# Patient Record
Sex: Female | Born: 2005 | Race: White | Hispanic: No | Marital: Single | Smoking: Never smoker
Health system: Southern US, Community
[De-identification: ages and names within clinical notes are randomized; demographics above are authoritative.]

## PROBLEM LIST (undated history)

## (undated) DIAGNOSIS — N809 Endometriosis, unspecified: Secondary | ICD-10-CM

## (undated) DIAGNOSIS — G90A Postural orthostatic tachycardia syndrome (POTS): Secondary | ICD-10-CM

## (undated) DIAGNOSIS — T7840XA Allergy, unspecified, initial encounter: Secondary | ICD-10-CM

## (undated) DIAGNOSIS — F419 Anxiety disorder, unspecified: Secondary | ICD-10-CM

## (undated) DIAGNOSIS — J45909 Unspecified asthma, uncomplicated: Secondary | ICD-10-CM

## (undated) DIAGNOSIS — F32A Depression, unspecified: Secondary | ICD-10-CM

## (undated) DIAGNOSIS — F329 Major depressive disorder, single episode, unspecified: Secondary | ICD-10-CM

## (undated) DIAGNOSIS — L309 Dermatitis, unspecified: Secondary | ICD-10-CM

## (undated) DIAGNOSIS — G43109 Migraine with aura, not intractable, without status migrainosus: Secondary | ICD-10-CM

## (undated) DIAGNOSIS — S022XXA Fracture of nasal bones, initial encounter for closed fracture: Secondary | ICD-10-CM

## (undated) HISTORY — DX: Dermatitis, unspecified: L30.9

## (undated) HISTORY — PX: EXCISION, ENDOMETRIOSIS, LAPAROSCOPIC: SHX7245

## (undated) HISTORY — DX: Migraine with aura, not intractable, without status migrainosus: G43.109

---
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
## 2023-02-17 ENCOUNTER — Encounter: Payer: Self-pay | Admitting: Adult Health

## 2023-02-17 ENCOUNTER — Ambulatory Visit (INDEPENDENT_AMBULATORY_CARE_PROVIDER_SITE_OTHER): Payer: 59 | Admitting: Adult Health

## 2023-02-17 DIAGNOSIS — F819 Developmental disorder of scholastic skills, unspecified: Secondary | ICD-10-CM

## 2023-02-17 DIAGNOSIS — F411 Generalized anxiety disorder: Secondary | ICD-10-CM | POA: Diagnosis not present

## 2023-02-17 DIAGNOSIS — F909 Attention-deficit hyperactivity disorder, unspecified type: Secondary | ICD-10-CM

## 2023-02-17 DIAGNOSIS — F319 Bipolar disorder, unspecified: Secondary | ICD-10-CM

## 2023-02-17 DIAGNOSIS — G4700 Insomnia, unspecified: Secondary | ICD-10-CM | POA: Diagnosis not present

## 2023-02-17 NOTE — Progress Notes (Signed)
Ashlee Perez Age [id number] [birth year] 17 y.o.  Subjective:   Patient ID:  Ashlee Perez is a 17 y.o. (DOB 01/13/2006) female.  Chief Complaint: No chief complaint on file.   HPI [name] presents to the office today for follow-up of BPD, anxiety, learning disorder, and insomnia.  Describes mood today as "not". Pleasant. Denies tearfulness. Mood symptoms - reports depression, anxiety, and irritability. Denies recent anxiety attacks. Reports worry, rumination, and over thinking. Mood has been more "consistent".Stating "I haven't been doing good". Has been trying to "fix" things on her own. Willing to restart medication. Starting therapy today at [facility] counseling. Decreased interest and motivation. Taking medications as prescribed.  Energy levels depends on sleep. Active, does not have a regular exercise routine.  Enjoys some usual interests and activities. Lives with boyfriend. Parents and sister are in [place]. Spending time with family.  Appetite adequate. Weight stable - 125 pounds - 65".  Sleeps well most nights. Averages 3 to 8 hours. Denies daytime napping. Focus and concentration "not good". Completing tasks. Managing aspects of household. Received high school diploma. 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 Denies SI or HI.  Denies AH or VH. Denies self harm.  Denies substance use.  Previous medications: Risperdal, Alvis Lemmings   Flowsheet Row ED from 02/12/2022 in [facility] Emergency Department at [facility] ED to Hosp-Admission (Discharged) from 04/24/2020 in [facility] PEDIATRICS  C-SSRS RISK CATEGORY High Risk High Risk       Review of Systems:  Review of Systems  Musculoskeletal:  Negative for gait problem.  Neurological:  Negative for tremors.  Psychiatric/Behavioral:         Please refer to HPI    Medications: I have reviewed the patient's current medications.  Current Outpatient Medications  Medication Sig Dispense  Refill   hydrOXYzine (ATARAX) 10 MG tablet TAKE 1 TABLET BY MOUTH THREE TIMES A DAY AS NEEDED 270 tablet 0   albuterol (VENTOLIN HFA) 108 (90 Base) MCG/ACT inhaler Inhale 2 puffs into the lungs every 6 (six) hours as needed. (Patient taking differently: Inhale 2 puffs into the lungs every 6 (six) hours as needed for wheezing or shortness of breath.) 1 each 1   EPINEPHrine (EPIPEN 2-PAK) 0.3 mg/0.3 mL IJ SOAJ injection Inject 0.3 mLs (0.3 mg total) into the muscle once. As needed for severe life-threatening allergic reaction (Patient taking differently: Inject 0.3 mg into the muscle once as needed for anaphylaxis (severe life-threatening allergic reaction).) 2 Device 2   norethindrone (MICRONOR) 0.35 MG tablet Take 1 tablet by mouth daily.     No current facility-administered medications for this visit.    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 nuts   Peanut-Containing Drug Products Anaphylaxis   Penicillins Hives   Serotonin Reuptake Inhibitors (Ssris) Other (See Comments)    Pt says that no one in her family can take them, they mess with "mentally"    Past Medical History:  Diagnosis Date   Allergy    Anxiety    Asthma    prn inhaler   Depression    Eczema    Migraine with aura    Nasal bone fx-closed 06/23/2012   has a cut on nose from the injury    Past Medical History, Surgical history, Social history, and Family history were reviewed and updated as appropriate.   Please see review of systems for further details on the patient's  review from today.   Objective:   Physical Exam:  There were no vitals taken for this visit.  Physical Exam Constitutional:      General: She is not in acute distress. Musculoskeletal:        General: No deformity.  Neurological:     Mental Status: She is alert and oriented to person, place, and time.     Coordination: Coordination normal.  Psychiatric:         Attention and Perception: Attention and perception normal. She does not perceive auditory or visual hallucinations.        Mood and Affect: Mood normal. Mood is not anxious or depressed. Affect is not labile, blunt, angry or inappropriate.        Speech: Speech normal.        Behavior: Behavior normal.        Thought Content: Thought content normal. Thought content is not paranoid or delusional. Thought content does not include homicidal or suicidal ideation. Thought content does not include homicidal or suicidal plan.        Cognition and Memory: Cognition and memory normal.        Judgment: Judgment normal.     Comments: Insight intact     Lab Review:     Component Value Date/Time   NA 139 02/12/2022 2348   NA 145 (H) 08/16/2015 0000   K 3.3 (L) 02/12/2022 2348   CL 107 02/12/2022 2348   CO2 23 02/12/2022 2348   GLUCOSE 123 (H) 02/12/2022 2348   BUN 12 02/12/2022 2348   BUN 9 08/16/2015 0000   CREATININE 0.72 02/12/2022 2348   CALCIUM 9.1 02/12/2022 2348   PROT 7.2 02/12/2022 2348   PROT 7.1 08/16/2015 0000   ALBUMIN 4.0 02/12/2022 2348   ALBUMIN 4.4 08/16/2015 0000   AST 21 02/12/2022 2348   ALT 12 02/12/2022 2348   ALKPHOS 67 02/12/2022 2348   BILITOT 0.9 02/12/2022 2348   BILITOT 0.3 08/16/2015 0000   GFRNONAA NOT CALCULATED 02/12/2022 2348   GFRAA NOT CALCULATED 04/24/2020 0526       Component Value Date/Time   WBC 8.7 02/12/2022 2348   RBC 4.72 02/12/2022 2348   HGB 13.1 02/12/2022 2348   HGB 13.4 08/16/2015 0000   HCT 39.6 02/12/2022 2348   HCT 38.9 08/16/2015 0000   PLT 334 02/12/2022 2348   PLT 354 08/16/2015 0000   MCV 83.9 02/12/2022 2348   MCV 77 08/16/2015 0000   MCH 27.8 02/12/2022 2348   MCHC 33.1 02/12/2022 2348   RDW 12.8 02/12/2022 2348   RDW 14.1 08/16/2015 0000   LYMPHSABS 3.6 02/12/2022 2348   LYMPHSABS 3.5 08/16/2015 0000   MONOABS 1.2 02/12/2022 2348   EOSABS 0.6 02/12/2022 2348   EOSABS 1.0 (H) 08/16/2015 0000   BASOSABS 0.0  02/12/2022 2348   BASOSABS 0.0 08/16/2015 0000    No results found for: "POCLITH", "LITHIUM"   No results found for: "PHENYTOIN", "PHENOBARB", "VALPROATE", "CBMZ"   .res Assessment: Plan:    Plan:  D/C Hydroxyzine 10mg  TID   Add Vraylar 1.5mg  - 2 weeks of samples given.   Gene sight testing given to patient for review.  Patient advised to contact office with any questions, adverse effects, or acute worsening in signs and symptoms.  RTC 2 weeks  There are no diagnoses linked to this encounter.   Please see After Visit Summary for patient specific instructions.  No future appointments.  No orders of the defined types were placed  in this encounter.   -------------------------------

## 2023-03-11 ENCOUNTER — Telehealth: Payer: Self-pay | Admitting: Adult Health

## 2023-03-11 ENCOUNTER — Ambulatory Visit: Payer: 59 | Admitting: Family Medicine

## 2023-03-11 ENCOUNTER — Other Ambulatory Visit: Payer: Self-pay

## 2023-03-11 ENCOUNTER — Encounter: Payer: Self-pay | Admitting: Family Medicine

## 2023-03-11 VITALS — BP 110/80 | HR 107 | Temp 98.5°F | Resp 16 | Wt 126.3 lb

## 2023-03-11 DIAGNOSIS — J310 Chronic rhinitis: Secondary | ICD-10-CM

## 2023-03-11 DIAGNOSIS — L2084 Intrinsic (allergic) eczema: Secondary | ICD-10-CM | POA: Diagnosis not present

## 2023-03-11 DIAGNOSIS — T7800XA Anaphylactic reaction due to unspecified food, initial encounter: Secondary | ICD-10-CM

## 2023-03-11 DIAGNOSIS — T7800XD Anaphylactic reaction due to unspecified food, subsequent encounter: Secondary | ICD-10-CM | POA: Diagnosis not present

## 2023-03-11 DIAGNOSIS — J4520 Mild intermittent asthma, uncomplicated: Secondary | ICD-10-CM | POA: Diagnosis not present

## 2023-03-11 MED ORDER — CARIPRAZINE HCL 1.5 MG PO CAPS: 1.5000 mg | ORAL_CAPSULE | Freq: Every day | ORAL | 1 refills | Status: DC

## 2023-03-11 MED ORDER — MOMETASONE FUROATE 0.1 % EX CREA: 1.0000 | TOPICAL_CREAM | Freq: Every day | CUTANEOUS | 3 refills | Status: DC

## 2023-03-11 NOTE — Progress Notes (Signed)
[address] [place] [address] Dept: [phone]  FOLLOW UP NOTE  Patient ID: Ashlee Perez, female    DOB: Jul 02, 2006  Age: 17 y.o. MRN: [id number] Date of Office Visit: 03/11/2023  Assessment  Chief Complaint: Eczema (Flaring up on hands and behind knees)  HPI Ashlee Perez is a 17 year old female who presents to the clinic for follow-up visit.  She was last seen in this clinic on 02/26/2021 as a new patient by Dr. Dellis Anes for evaluation of asthma, chronic rhinitis, atopic dermatitis, and food allergy to peanuts.    At today's visit, she reports her chronic rhinitis has been moderately well-controlled with occasional clear rhinorrhea and nasal congestion.  She is not currently taking an antihistamine or using nasal saline rinses or saline nasal spray.  Her last environmental allergy testing was on 02/26/2021 and was negative to the adult environmental percutaneous testing.  Intradermal testing was not done at that time.    Atopic dermatitis is reported as poorly controlled with red and itchy areas occurring in a flare in remission pattern.  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  She reports other areas affected include her popliteal fossa, antecubital fossa, and the extensor surfaces of bilateral elbows.  She continues a daily moisturizing routine and has previously used mometasone with relief of stubborn red itchy areas.  She occasionally uses cocoa butter for relief of symptoms.  She reports that her symptoms usually began in the winter months, however, she reports that she has been experiencing more symptoms over this springtime.    She continues to avoid peanuts and tree nuts.  Food allergy testing was positive to peanut and negative to tree nuts on 02/26/2021.  She is moderately interested in retesting food allergies and possibly moving forward with a tree nut challenge if testing looks favorable.  She  reports that her primary care provider has been updating her epinephrine autoinjector set and that her current set is within date.  Asthma is reported as well-controlled with no shortness of breath, cough, or wheeze with activity or at rest.  She reports that she has an albuterol inhaler that she uses about twice a year.  Her primary care provider has been refilling this medication for her and she reports that her albuterol inhaler is within date.  Her current medications are listed in the chart. Drug Allergies:  Allergies  Allergen Reactions   Other Anaphylaxis    Reaction to tree nuts   Peanut-Containing Drug Products Anaphylaxis   Penicillins Hives   Serotonin Reuptake Inhibitors (Ssris) Other (See Comments)    Pt says that no one in her family can take them, they mess with "mentally"    Physical Exam: BP 110/80   Pulse (!) 107   Temp 98.5 F (36.9 C) (Temporal)   Resp 16   Wt 126 lb 4.8 oz (57.3 kg)   SpO2 96%   BMI 21.02 kg/m    Physical Exam Vitals reviewed.  Constitutional:      Appearance: Normal appearance.  HENT:     Head: Normocephalic and atraumatic.     Right Ear: Tympanic membrane normal.     Left Ear: Tympanic membrane normal.     Nose:     Comments: Normal nares normal.  Pharynx normal.  Ears normal.  Eyes normal.    Mouth/Throat:     Pharynx: Oropharynx is clear.  Eyes:     Conjunctiva/sclera: Conjunctivae normal.  Cardiovascular:     Rate and Rhythm: Normal rate and regular rhythm.     Heart sounds: Normal heart sounds. No murmur heard. Pulmonary:     Effort: Pulmonary effort is normal.     Breath sounds: Normal breath sounds.     Comments: Lungs clear to auscultation Musculoskeletal:        General: Normal range of motion.     Cervical back: Normal range of motion and neck supple.  Skin:    General: Skin is warm and dry.  Neurological:     Mental Status: She is alert and oriented to person, place, and time.  Psychiatric:        Mood and  Affect: Mood normal.        Behavior: Behavior normal.        Thought Content: Thought content normal.        Judgment: Judgment normal.      Assessment and Plan: 1. Mild intermittent asthma without complication   2. Chronic rhinitis   3. Intrinsic atopic dermatitis   4. Allergy with anaphylaxis due to food     Meds ordered this encounter  Medications   mometasone (ELOCON) 0.1 % cream    Sig: Apply 1 Application topically daily.    Dispense:  45 g    Refill:  3    Patient Instructions  Asthma Continue albuterol 2 puffs once every 4 hours as needed for cough or wheeze as prescribed by your primary care provider  Chronic rhinitis Continue an antihistamine once a day as needed for runny nose or itch Consider saline nasal rinses as needed for nasal symptoms. Use this before any medicated nasal sprays for best result  Atopic dermatitis Continue a twice a day moisturizing routine Try Eucrisa to red and itchy areas twice a day as needed. This medication does not contain a steroid Restart mometasone 0.1% cream twice a day as needed to red and itchy areas under your face. Do not use longer than 2 weeks in a row.  Food allergy Continue to avoid peanuts and tree nuts. Return to the clinic to update your tree nut allergy testing.  If this is negative we can move forward with a tree nut challenge.  Call the clinic if this treatment plan is not working well for you.  Follow up in 3 months or sooner if needed.   Return in about 3 months (around 06/11/2023), or if symptoms worsen or fail to improve.    Thank you for the opportunity to care for this patient.  Please do not hesitate to contact me with questions.  Thermon Leyland, FNP [facility]

## 2023-03-11 NOTE — Patient Instructions (Addendum)
Asthma Continue albuterol 2 puffs once every 4 hours as needed for cough or wheeze as prescribed by your primary care provider  Chronic rhinitis Continue an antihistamine once a day as needed for runny nose or itch Consider saline nasal rinses as needed for nasal symptoms. Use this before any medicated nasal sprays for best result  Atopic dermatitis Continue a twice a day moisturizing routine Try Eucrisa to red and itchy areas twice a day as needed. This medication does not contain a steroid Restart mometasone 0.1% cream twice a day as needed to red and itchy areas under your face. Do not use longer than 2 weeks in a row.  Food allergy Continue to avoid peanuts and tree nuts. Return to the clinic to update your tree nut allergy testing.  If this is negative we can move forward with a tree nut challenge.  Call the clinic if this treatment plan is not working well for you.  Follow up in 3 months or sooner if needed.  Skin care recommendations   Bath time: Always use lukewarm water. AVOID very hot or cold water. Keep bathing time to 5-10 minutes. Do NOT use bubble bath. Use a mild soap and use just enough to wash the dirty areas. Do NOT scrub skin vigorously.  After bathing, pat dry your skin with a towel. Do NOT rub or scrub the skin.   Moisturizers and prescriptions:  ALWAYS apply moisturizers immediately after bathing (within 3 minutes). This helps to lock-in moisture. Use the moisturizer several times a day over the whole body. Good summer moisturizers include: Aveeno, CeraVe, Cetaphil. Good winter moisturizers include: Aquaphor, Vaseline, Cerave, Cetaphil, Eucerin, Vanicream. When using moisturizers along with medications, the moisturizer should be applied about one hour after applying the medication to prevent diluting effect of the medication or moisturize around where you applied the medications. When not using medications, the moisturizer can be continued twice daily as  maintenance.   Laundry and clothing: Avoid laundry products with added color or perfumes. Use unscented hypo-allergenic laundry products such as Tide free, Cheer free & gentle, and All free and clear.  If the skin still seems dry or sensitive, you can try double-rinsing the clothes. Avoid tight or scratchy clothing such as wool. Do not use fabric softeners or dyer sheets.

## 2023-03-11 NOTE — Telephone Encounter (Signed)
Pt called in and is requesting Vraylar 1.5mg  rx to be called in. She has had samples.  [facility]t [facility] [address] [facility] [place], [place]

## 2023-03-11 NOTE — Telephone Encounter (Signed)
Sent!

## 2023-03-17 ENCOUNTER — Encounter: Payer: Self-pay | Admitting: Adult Health

## 2023-03-17 ENCOUNTER — Ambulatory Visit (INDEPENDENT_AMBULATORY_CARE_PROVIDER_SITE_OTHER): Payer: 59 | Admitting: Adult Health

## 2023-03-17 DIAGNOSIS — F819 Developmental disorder of scholastic skills, unspecified: Secondary | ICD-10-CM | POA: Diagnosis not present

## 2023-03-17 DIAGNOSIS — F411 Generalized anxiety disorder: Secondary | ICD-10-CM | POA: Diagnosis not present

## 2023-03-17 DIAGNOSIS — G4700 Insomnia, unspecified: Secondary | ICD-10-CM | POA: Diagnosis not present

## 2023-03-17 DIAGNOSIS — F319 Bipolar disorder, unspecified: Secondary | ICD-10-CM | POA: Diagnosis not present

## 2023-03-17 NOTE — Progress Notes (Signed)
Ashlee Perez [id number] 09/26/2006 17 y.o.  Subjective:   Patient ID:  Ashlee Perez is a 17 y.o. (DOB 19-Mar-2006) female.  Chief Complaint: No chief complaint on file.   HPI Ashlee Perez presents to the office today for follow-up of BPD, anxiety, learning disorder, and insomnia.  Describes mood today as "better". Pleasant. Denies tearfulness. Mood symptoms - denies depression, anxiety, and irritability. Denies recent anxiety attacks. Reports some worry and over thinking. Mood has been "very consistent" Stating "I feel like I'm doing better". Feels like the addition of Vraylar has been helpful. Seeing a therapist at [facility] counseling. Improved interest and motivation. Taking medications as prescribed.  Energy levels "a lot better". Active, does not have a regular exercise routine.  Enjoys some usual interests and activities. Lives with boyfriend. Parents and sister are in [place]. Spending time with family.  Appetite adequate. Weight stable - 125 pounds - 65".  Sleeps well most nights. Averages 7 to 8hours. Denies daytime napping. Focus and concentration "decent". Completing tasks. Managing aspects of household. Received high school diploma. 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 May start a job at [employer]. Denies SI or HI.  Denies AH or VH. Denies self harm.  Denies substance use.  Previous medications: Risperdal, Alvis Lemmings   Flowsheet Row ED from [facility]/12/2022 in [facility] Emergency Department at [facility] ED to Hosp-Admission (Discharged) from 04/24/2020 in [facility] PEDIATRICS  C-SSRS RISK CATEGORY High Risk High Risk        Review of Systems:  Review of Systems  Musculoskeletal:  Negative for gait problem.  Neurological:  Negative for tremors.  Psychiatric/Behavioral:         Please refer to HPI    Medications: I have reviewed the patient's current medications.  Current Outpatient Medications  Medication Sig Dispense  Refill   albuterol (VENTOLIN HFA) 108 (90 Base) MCG/ACT inhaler Inhale 2 puffs into the lungs every 6 (six) hours as needed. (Patient taking differently: Inhale 2 puffs into the lungs every 6 (six) hours as needed for wheezing or shortness of breath.) 1 each 1   cariprazine (VRAYLAR) 1.5 MG capsule Take 1 capsule (1.5 mg total) by mouth daily. 30 capsule 1   EPINEPHrine (EPIPEN 2-PAK) 0.3 mg/0.3 mL IJ SOAJ injection Inject 0.3 mLs (0.3 mg total) into the muscle once. As needed for severe life-threatening allergic reaction (Patient taking differently: Inject 0.3 mg into the muscle once as needed for anaphylaxis (severe life-threatening allergic reaction).) 2 Device 2   mometasone (ELOCON) 0.1 % cream Apply 1 Application topically daily. 45 g 3   norethindrone (MICRONOR) 0.35 MG tablet Take 1 tablet by mouth daily.     No current facility-administered medications for this visit.    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 nuts   Peanut-Containing Drug Products Anaphylaxis   Penicillins Hives   Serotonin Reuptake Inhibitors (Ssris) Other (See Comments)    Pt says that no one in her family can take them, they mess with "mentally"    Past Medical History:  Diagnosis Date   Allergy    Anxiety    Asthma    prn inhaler   Depression    Eczema    Migraine with aura    Nasal bone fx-closed 06/23/2012   has a cut on nose from the injury    Past Medical History, Surgical history, Social history, and Family history were reviewed  and updated as appropriate.   Please see review of systems for further details on the patient's review from today.   Objective:   Physical Exam:  There were no vitals taken for this visit.  Physical Exam Constitutional:      General: She is not in acute distress. Musculoskeletal:        General: No deformity.  Neurological:     Mental Status: She is alert and oriented to person,  place, and time.     Coordination: Coordination normal.  Psychiatric:        Attention and Perception: Attention and perception normal. She does not perceive auditory or visual hallucinations.        Mood and Affect: Mood normal. Mood is not anxious or depressed. Affect is not labile, blunt, angry or inappropriate.        Speech: Speech normal.        Behavior: Behavior normal.        Thought Content: Thought content normal. Thought content is not paranoid or delusional. Thought content does not include homicidal or suicidal ideation. Thought content does not include homicidal or suicidal plan.        Cognition and Memory: Cognition and memory normal.        Judgment: Judgment normal.     Comments: Insight intact     Lab Review:     Component Value Date/Time   NA 139 02/12/2022 2348   NA 145 (H) 08/16/2015 0000   K 3.3 (L) 02/12/2022 2348   CL 107 02/12/2022 2348   CO2 23 02/12/2022 2348   GLUCOSE 123 (H) 02/12/2022 2348   BUN 12 02/12/2022 2348   BUN 9 08/16/2015 0000   CREATININE 0.72 02/12/2022 2348   CALCIUM 9.1 02/12/2022 2348   PROT 7.2 02/12/2022 2348   PROT 7.1 08/16/2015 0000   ALBUMIN 4.0 02/12/2022 2348   ALBUMIN 4.4 08/16/2015 0000   AST 21 02/12/2022 2348   ALT 12 02/12/2022 2348   ALKPHOS 67 02/12/2022 2348   BILITOT 0.9 02/12/2022 2348   BILITOT 0.3 08/16/2015 0000   GFRNONAA NOT CALCULATED 02/12/2022 2348   GFRAA NOT CALCULATED 04/24/2020 0526       Component Value Date/Time   WBC 8.7 02/12/2022 2348   RBC 4.72 02/12/2022 2348   HGB 13.1 02/12/2022 2348   HGB 13.4 08/16/2015 0000   HCT 39.6 02/12/2022 2348   HCT 38.9 08/16/2015 0000   PLT 334 02/12/2022 2348   PLT 354 08/16/2015 0000   MCV 83.9 02/12/2022 2348   MCV 77 08/16/2015 0000   MCH 27.8 02/12/2022 2348   MCHC 33.1 02/12/2022 2348   RDW 12.8 02/12/2022 2348   RDW 14.1 08/16/2015 0000   LYMPHSABS 3.6 02/12/2022 2348   LYMPHSABS 3.5 08/16/2015 0000   MONOABS 1.2 02/12/2022 2348   EOSABS  0.6 02/12/2022 2348   EOSABS 1.0 (H) 08/16/2015 0000   BASOSABS 0.0 02/12/2022 2348   BASOSABS 0.0 08/16/2015 0000    No results found for: "POCLITH", "LITHIUM"   No results found for: "PHENYTOIN", "PHENOBARB", "VALPROATE", "CBMZ"   .res Assessment: Plan:    Plan:  Continue Vraylar 1.5mg  daily   Patient advised to contact office with any questions, adverse effects, or acute worsening in signs and symptoms.  RTC 6 weeks  There are no diagnoses linked to this encounter.   Please see After Visit Summary for patient specific instructions.  No future appointments.  No orders of the defined types were placed in this encounter.   -------------------------------

## 2023-03-23 ENCOUNTER — Inpatient Hospital Stay (HOSPITAL_COMMUNITY)
Admission: AD | Admit: 2023-03-23 | Discharge: 2023-03-23 | Disposition: A | Discharge status: Home / Self Care | Payer: 59 | Attending: Family Medicine | Admitting: Family Medicine

## 2023-03-23 ENCOUNTER — Telehealth (HOSPITAL_BASED_OUTPATIENT_CLINIC_OR_DEPARTMENT_OTHER): Payer: Self-pay | Admitting: Obstetrics & Gynecology

## 2023-03-23 ENCOUNTER — Other Ambulatory Visit: Payer: Self-pay

## 2023-03-23 DIAGNOSIS — Z3202 Encounter for pregnancy test, result negative: Secondary | ICD-10-CM | POA: Diagnosis present

## 2023-03-23 DIAGNOSIS — N939 Abnormal uterine and vaginal bleeding, unspecified: Secondary | ICD-10-CM | POA: Insufficient documentation

## 2023-03-23 LAB — POCT PREGNANCY, URINE: Preg Test, Ur: NEGATIVE

## 2023-03-23 NOTE — MAU Provider Note (Signed)
None    S Ms. Ashlee Perez is a 17 y.o. G0P0000 patient who presents to MAU today with complaint of bleeding and cramping. She reports vaginal bleeding and cramping started 5 days ago. It initially started as spotting however yesterday became heavier. She reports she had to change a regular sized tampon every 2 hours however if she wore a pad instead of a tampon there would not be much blood on it. She reports she passed several quarter-sized clots. She is concerned about having a miscarriage despite having negative pregnancy tests at home. Prior to this current episode of bleeding, her last menstrual period was on 02/26/2023. She is not on contraception. She does not desire to be pregnant. She has an appointment with an OBGYN next Monday at [facility] in [place] to see if she "has any reproductive problems".   O BP 133/72 (BP Location: Right Arm)   Pulse 72   Temp 97.9 F (36.6 C) (Oral)   Resp 18   Ht 5\' 5"  (1.651 m)   Wt 56.6 kg   LMP 02/26/2023   SpO2 99%   BMI 20.77 kg/m   Physical Exam Vitals and nursing note reviewed.  Constitutional:      General: She is not in acute distress. Eyes:     Extraocular Movements: Extraocular movements intact.     Pupils: Pupils are equal, round, and reactive to light.  Cardiovascular:     Rate and Rhythm: Normal rate.  Pulmonary:     Effort: Pulmonary effort is normal.  Abdominal:     Palpations: Abdomen is soft.     Tenderness: There is no abdominal tenderness.  Musculoskeletal:        General: Normal range of motion.     Cervical back: Normal range of motion.  Skin:    General: Skin is warm and dry.  Neurological:     General: No focal deficit present.     Mental Status: She is alert and oriented to person, place, and time.  Psychiatric:        Mood and Affect: Mood normal.        Behavior: Behavior normal.    Results for orders placed or performed during the hospital encounter of 03/23/23 (from the past 24 hour(s))  Pregnancy,  urine POC     Status: None   Collection Time: 03/23/23  3:38 PM  Result Value Ref Range   Preg Test, Ur NEGATIVE NEGATIVE   A Medical screening exam complete 1. Pregnancy test negative   2. Vaginal bleeding    P Discharge from MAU in stable condition Recommend scheduled Ibuprofen Q6H for 2-3 days Keep appointment with OBGYN as scheduled Bleeding precautions given  Brand Males, CNM 03/23/2023 6:45 PM

## 2023-03-23 NOTE — Telephone Encounter (Signed)
17 yo G0 SWF calling to say she thinks she is having a miscarriage and wants to know if she should go to the emergency room.  Records reviewed prior to calling pt.  She was seen in the MAU this afternoon.  MAU note reviewed.  Pulse 72.  BP 133/72.  Resp 18.    Discussed with pt UPT was negative (and she has not taken any home UPTs) so likely this is an abnormal menstrual cycle.  To her, biggest concern is abnormal bleeding.  Today, if has a pad on, there is just some spotting.  But if wears a tampon, this will need to be changed in an hour or so.  Is having some cramping.  H/o micronor use but stopped this due to side effects.    Bleeding precautions given.  Advised pt if she felt she needed to go to the ER, then she should go.  Oral progesterone offered.  She declined but felt she would prefer to not to to ER but be seen in the office if possible.  Advised I would send communication to [facility] office for possible work in appt.

## 2023-03-23 NOTE — MAU Note (Signed)
Ashlee Perez is a 17 y.o. at Unknown here in MAU reporting: she's having VB and abdominal cramping.  States VB was initially spotting but now bleeding heavier and passing clots. Reports when wearing a tampon, changing every 2 hours. Hasn't taken a HPT, assuming she's pregnant because she doesn't usually have cramping or pass clots during her cycle. LMP: 02/26/2023 Onset of complaint: 5 days ago Pain score: 3 Vitals:   03/23/23 1527  BP: 133/72  Pulse: 72  Resp: 18  Temp: 97.9 F (36.6 C)  SpO2: 99%     FHT:NA Lab orders placed from triage:   UPT

## 2023-03-24 NOTE — Telephone Encounter (Signed)
Spoke with patient, advised per Dr. Jertson. Patient verbalizes understanding and is agreeable.  

## 2023-03-24 NOTE — Telephone Encounter (Signed)
Spoke with patient. Patient reports continued lower abdominal cramping, currently 2 out of 10 on pain scale. Took OTC ibuprofen 30 min ago, pain started at 7 out of 10. Describes cramping as "more extreme than period", comes and goes. Bleeding is spotting with several quarter to half dollar size clots. UPT negative during ER visit 02/20/23. Reports nausea, denies vomiting, fever/chills. No longer taking POP. Denies s/s of anemia. Advised I will review with covering provider and return call, patient agreeable. Patient is available for OV today if recommended.   Dr. Oscar La  -please review and advise.

## 2023-03-24 NOTE — Telephone Encounter (Signed)
If her pain is managed with the ibuprofen she can take it for the next few days. She has an appointment with new GYN on Monday. If her pain is severe prior to then or if she is saturating more than a pad an hour, she needs to go to the ER.

## 2023-04-28 ENCOUNTER — Ambulatory Visit (INDEPENDENT_AMBULATORY_CARE_PROVIDER_SITE_OTHER): Payer: Self-pay | Admitting: Adult Health

## 2023-04-28 DIAGNOSIS — Z0389 Encounter for observation for other suspected diseases and conditions ruled out: Secondary | ICD-10-CM

## 2023-04-28 NOTE — Progress Notes (Signed)
Patient no show appointment. ? ?

## 2023-05-24 ENCOUNTER — Other Ambulatory Visit: Payer: Self-pay | Admitting: Adult Health

## 2023-07-11 ENCOUNTER — Other Ambulatory Visit: Payer: Self-pay | Admitting: Adult Health

## 2023-07-15 ENCOUNTER — Other Ambulatory Visit: Payer: Self-pay

## 2023-07-15 ENCOUNTER — Telehealth: Payer: Self-pay | Admitting: Adult Health

## 2023-07-15 MED ORDER — CARIPRAZINE HCL 1.5 MG PO CAPS: 1.5000 mg | ORAL_CAPSULE | Freq: Every day | ORAL | 0 refills | Status: DC

## 2023-07-15 NOTE — Telephone Encounter (Signed)
PENDED

## 2023-07-15 NOTE — Telephone Encounter (Signed)
Ashlee Perez called at 9:45 to request refill of her Vraylar.  Made appt for 11/6.  [id number] [place], [place] - [address]

## 2023-08-05 ENCOUNTER — Ambulatory Visit: Payer: 59 | Admitting: Adult Health

## 2023-08-11 ENCOUNTER — Ambulatory Visit: Payer: 59 | Admitting: Adult Health

## 2023-08-18 ENCOUNTER — Encounter: Payer: Self-pay | Admitting: Adult Health

## 2023-08-18 ENCOUNTER — Ambulatory Visit: Payer: 59 | Admitting: Adult Health

## 2023-08-18 DIAGNOSIS — F819 Developmental disorder of scholastic skills, unspecified: Secondary | ICD-10-CM | POA: Diagnosis not present

## 2023-08-18 DIAGNOSIS — F411 Generalized anxiety disorder: Secondary | ICD-10-CM

## 2023-08-18 DIAGNOSIS — G4700 Insomnia, unspecified: Secondary | ICD-10-CM | POA: Diagnosis not present

## 2023-08-18 DIAGNOSIS — F319 Bipolar disorder, unspecified: Secondary | ICD-10-CM | POA: Diagnosis not present

## 2023-08-18 MED ORDER — CARIPRAZINE HCL 1.5 MG PO CAPS
1.5000 mg | ORAL_CAPSULE | Freq: Every day | ORAL | 2 refills | Status: DC
Start: 2023-08-18 — End: 2023-10-13

## 2023-08-18 NOTE — Progress Notes (Signed)
Ashlee Perez [id number] 2005-10-30 17 y.o.  Subjective:   Patient ID:  Ashlee Perez is a 17 y.o. (DOB [birth year]) female.  Chief Complaint: No chief complaint on file.   HPI Ashlee Perez presents to the office today for follow-up of BPD, anxiety, learning disorder, and insomnia.  Describes mood today as "ok". Pleasant. Denies tearfulness. Mood symptoms - reports some depression, anxiety, and irritability. Denies recent anxiety attacks. Reports some worry and over thinking. Denies rumination. Mood is stable. Stating "I am able to manage and regulate". Feels like the  Vraylar works well. Improved interest and motivation. Taking medications as prescribed.  Energy levels "a lot better". Active, does not have a regular exercise routine.  Enjoys some usual interests and activities. Lives with boyfriend. Parents and sister are in [place]. Spending time with family.  Appetite adequate. Weight stable - 125 pounds - 65".  Sleeps well most nights. Averages 7 to 8 hours. Reports some daytime napping. Focus and concentration "ok". Completing tasks. Managing aspects of household. Received high school diploma. Working at [employer].  Denies SI or HI.  Denies AH or VH. Denies self harm.  Denies substance use.  Previous medications: Risperdal, Ashlee Perez   Flowsheet Row ED from 02/12/2022 in [facility] Emergency Department at [facility] ED to Hosp-Admission (Discharged) from 04/24/2020 in [facility] PEDIATRICS  C-SSRS RISK CATEGORY High Risk High Risk        Review of Systems:  Review of Systems  Musculoskeletal:  Negative for gait problem.  Neurological:  Negative for tremors.  Psychiatric/Behavioral:         Please refer to HPI    Medications: I have reviewed the patient's current medications.  Current Outpatient Medications  Medication Sig Dispense Refill   albuterol (VENTOLIN HFA) 108 (90 Base) MCG/ACT inhaler Inhale 2 puffs into the  lungs every 6 (six) hours as needed. (Patient taking differently: Inhale 2 puffs into the lungs every 6 (six) hours as needed for wheezing or shortness of breath.) 1 each 1   cariprazine (VRAYLAR) 1.5 MG capsule Take 1 capsule (1.5 mg total) by mouth daily. 30 capsule 0   EPINEPHrine (EPIPEN 2-PAK) 0.3 mg/0.3 mL IJ SOAJ injection Inject 0.3 mLs (0.3 mg total) into the muscle once. As needed for severe life-threatening allergic reaction (Patient taking differently: Inject 0.3 mg into the muscle once as needed for anaphylaxis (severe life-threatening allergic reaction).) 2 Device 2   mometasone (ELOCON) 0.1 % cream Apply 1 Application topically daily. 45 g 3   norethindrone (MICRONOR) 0.35 MG tablet Take 1 tablet by mouth daily.     No current facility-administered medications for this visit.    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 nuts   Peanut-Containing Drug Products Anaphylaxis   Penicillins Hives   Serotonin Reuptake Inhibitors (Ssris) Other (See Comments)    Pt says that no one in her family can take them, they mess with "mentally"    Past Medical History:  Diagnosis Date   Allergy    Anxiety    Asthma    prn inhaler   Depression    Eczema    Migraine with aura    Nasal bone fx-closed 06/23/2012   has a cut on nose from the injury    Past Medical History, Surgical history, Social history, and Family history were reviewed and updated as appropriate.   Please see review of systems for  further details on the patient's review from today.   Objective:   Physical Exam:  There were no vitals taken for this visit.  Physical Exam Constitutional:      General: She is not in acute distress. Musculoskeletal:        General: No deformity.  Neurological:     Mental Status: She is alert and oriented to person, place, and time.     Coordination: Coordination normal.  Psychiatric:        Attention and  Perception: Attention and perception normal. She does not perceive auditory or visual hallucinations.        Mood and Affect: Mood normal. Mood is not anxious or depressed. Affect is not labile, blunt, angry or inappropriate.        Speech: Speech normal.        Behavior: Behavior normal.        Thought Content: Thought content normal. Thought content is not paranoid or delusional. Thought content does not include homicidal or suicidal ideation. Thought content does not include homicidal or suicidal plan.        Cognition and Memory: Cognition and memory normal.        Judgment: Judgment normal.     Comments: Insight intact     Lab Review:     Component Value Date/Time   NA 139 02/12/2022 2348   NA 145 (H) 08/16/2015 0000   K 3.3 (L) 02/12/2022 2348   CL 107 02/12/2022 2348   CO2 23 02/12/2022 2348   GLUCOSE 123 (H) 02/12/2022 2348   BUN 12 02/12/2022 2348   BUN 9 08/16/2015 0000   CREATININE 0.72 02/12/2022 2348   CALCIUM 9.1 02/12/2022 2348   PROT 7.2 02/12/2022 2348   PROT 7.1 08/16/2015 0000   ALBUMIN 4.0 02/12/2022 2348   ALBUMIN 4.4 08/16/2015 0000   AST 21 02/12/2022 2348   ALT 12 02/12/2022 2348   ALKPHOS 67 02/12/2022 2348   BILITOT 0.9 02/12/2022 2348   BILITOT 0.3 08/16/2015 0000   GFRNONAA NOT CALCULATED 02/12/2022 2348   GFRAA NOT CALCULATED 04/24/2020 0526       Component Value Date/Time   WBC 8.7 02/12/2022 2348   RBC 4.72 02/12/2022 2348   HGB 13.1 02/12/2022 2348   HGB 13.4 08/16/2015 0000   HCT 39.6 02/12/2022 2348   HCT 38.9 08/16/2015 0000   PLT 334 02/12/2022 2348   PLT 354 08/16/2015 0000   MCV 83.9 02/12/2022 2348   MCV 77 08/16/2015 0000   MCH 27.8 02/12/2022 2348   MCHC 33.1 02/12/2022 2348   RDW 12.8 02/12/2022 2348   RDW 14.1 08/16/2015 0000   LYMPHSABS 3.6 02/12/2022 2348   LYMPHSABS 3.5 08/16/2015 0000   MONOABS 1.2 02/12/2022 2348   EOSABS 0.6 02/12/2022 2348   EOSABS 1.0 (H) 08/16/2015 0000   BASOSABS 0.0 02/12/2022 2348    BASOSABS 0.0 08/16/2015 0000    No results found for: "POCLITH", "LITHIUM"   No results found for: "PHENYTOIN", "PHENOBARB", "VALPROATE", "CBMZ"   .res Assessment: Plan:    Plan:  Continue Vraylar 1.5mg  daily   Patient advised to contact office with any questions, adverse effects, or acute worsening in signs and symptoms.  RTC 3 months  There are no diagnoses linked to this encounter.   Please see After Visit Summary for patient specific instructions.  Future Appointments  Date Time Provider Department Center  08/18/2023  1:40 PM Braileigh Landenberger, Thereasa Solo, NP [facility] None    No orders of the defined  types were placed in this encounter.   -------------------------------

## 2023-09-08 ENCOUNTER — Emergency Department (HOSPITAL_BASED_OUTPATIENT_CLINIC_OR_DEPARTMENT_OTHER): Payer: 59

## 2023-09-08 ENCOUNTER — Encounter (HOSPITAL_BASED_OUTPATIENT_CLINIC_OR_DEPARTMENT_OTHER): Payer: Self-pay | Admitting: Emergency Medicine

## 2023-09-08 ENCOUNTER — Emergency Department (HOSPITAL_BASED_OUTPATIENT_CLINIC_OR_DEPARTMENT_OTHER)
Admission: EM | Admit: 2023-09-08 | Discharge: 2023-09-09 | Disposition: A | Discharge status: Home / Self Care | Payer: 59 | Attending: Emergency Medicine | Admitting: Emergency Medicine

## 2023-09-08 ENCOUNTER — Other Ambulatory Visit: Payer: Self-pay

## 2023-09-08 DIAGNOSIS — R072 Precordial pain: Secondary | ICD-10-CM | POA: Diagnosis present

## 2023-09-08 DIAGNOSIS — E876 Hypokalemia: Secondary | ICD-10-CM | POA: Diagnosis not present

## 2023-09-08 DIAGNOSIS — Z91010 Allergy to peanuts: Secondary | ICD-10-CM | POA: Diagnosis not present

## 2023-09-08 DIAGNOSIS — R0789 Other chest pain: Secondary | ICD-10-CM | POA: Insufficient documentation

## 2023-09-08 DIAGNOSIS — R0602 Shortness of breath: Secondary | ICD-10-CM | POA: Diagnosis not present

## 2023-09-08 DIAGNOSIS — J45909 Unspecified asthma, uncomplicated: Secondary | ICD-10-CM | POA: Diagnosis not present

## 2023-09-08 LAB — BASIC METABOLIC PANEL
Anion gap: 7 (ref 5–15)
BUN: 17 mg/dL (ref 6–20)
CO2: 25 mmol/L (ref 22–32)
Calcium: 8.9 mg/dL (ref 8.9–10.3)
Chloride: 102 mmol/L (ref 98–111)
Creatinine, Ser: 0.79 mg/dL (ref 0.44–1.00)
GFR, Estimated: >60 mL/min (ref >60)
Glucose, Bld: 95 mg/dL (ref 70–99)
Potassium: 3.4 mmol/L — ABNORMAL LOW (ref 3.5–5.1)
Sodium: 134 mmol/L — ABNORMAL LOW (ref 135–145)

## 2023-09-08 LAB — CBC
HCT: 37.6 % (ref 36.0–46.0)
Hemoglobin: 12.9 g/dL (ref 12.0–15.0)
MCH: 28.2 pg (ref 26.0–34.0)
MCHC: 34.3 g/dL (ref 30.0–36.0)
MCV: 82.1 fL (ref 80.0–100.0)
Platelets: 291 10*3/uL (ref 150–400)
RBC: 4.58 MIL/uL (ref 3.87–5.11)
RDW: 12.6 % (ref 11.5–15.5)
WBC: 8.2 10*3/uL (ref 4.0–10.5)
nRBC: 0 % (ref 0.0–0.2)

## 2023-09-08 LAB — PREGNANCY, URINE: Preg Test, Ur: NEGATIVE

## 2023-09-08 LAB — TROPONIN I (HIGH SENSITIVITY)
Troponin I (High Sensitivity): <2 ng/L (ref <18)
Troponin I (High Sensitivity): <2 ng/L (ref <18)

## 2023-09-08 LAB — D-DIMER, QUANTITATIVE: D-Dimer, Quant: 1.48 ug{FEU}/mL — ABNORMAL HIGH (ref 0.00–0.50)

## 2023-09-08 MED ORDER — IOHEXOL 350 MG/ML SOLN
75.0000 mL | Freq: Once | INTRAVENOUS | Status: AC | PRN
  Administered 2023-09-08: 75 mL via INTRAVENOUS

## 2023-09-08 NOTE — ED Provider Notes (Signed)
[facility] Provider Note   CSN: [id number] Arrival date & time: 09/08/23  2054     History  Chief Complaint  Patient presents with   Chest Pain    Ashlee Perez is a 17 y.o. female.  The history is provided by the patient and medical records. No language interpreter was used.  Chest Pain Pain location:  L chest and substernal area Pain quality: sharp, stabbing and tightness   Pain radiates to:  Does not radiate Pain severity:  Moderate Onset quality:  Gradual Duration:  1 day Timing:  Intermittent Progression:  Waxing and waning Chronicity:  New Relieved by:  Nothing Worsened by:  Deep breathing Ineffective treatments:  None tried Associated symptoms: palpitations and shortness of breath   Associated symptoms: no abdominal pain, no altered mental status, no back pain, no claudication, no cough, no diaphoresis, no fatigue, no fever, no headache, no nausea, no near-syncope, no syncope (2 weeks ago had an episode) and no vomiting   Risk factors: not pregnant and no prior DVT/PE        Home Medications Prior to Admission medications   Medication Sig Start Date End Date Taking? Authorizing Provider  albuterol (VENTOLIN HFA) 108 (90 Base) MCG/ACT inhaler Inhale 2 puffs into the lungs every 6 (six) hours as needed. Patient taking differently: Inhale 2 puffs into the lungs every 6 (six) hours as needed for wheezing or shortness of breath. 02/26/21   Alfonse Spruce, MD  cariprazine (VRAYLAR) 1.5 MG capsule Take 1 capsule (1.5 mg total) by mouth daily. 08/18/23   Mozingo, Thereasa Solo, NP  EPINEPHrine (EPIPEN 2-PAK) 0.3 mg/0.3 mL IJ SOAJ injection Inject 0.3 mLs (0.3 mg total) into the muscle once. As needed for severe life-threatening allergic reaction Patient taking differently: Inject 0.3 mg into the muscle once as needed for anaphylaxis (severe life-threatening allergic reaction). 08/15/15   Kozlow, Alvira Philips, MD   mometasone (ELOCON) 0.1 % cream Apply 1 Application topically daily. 03/11/23   Hetty Blend, FNP  norethindrone (MICRONOR) 0.35 MG tablet Take 1 tablet by mouth daily.    [provider]      Allergies    Other, Peanut-containing drug products, Penicillins, and Serotonin reuptake inhibitors (ssris)    Review of Systems   Review of Systems  Constitutional:  Negative for chills, diaphoresis, fatigue and fever.  HENT:  Negative for congestion.   Eyes:  Negative for visual disturbance.  Respiratory:  Positive for chest tightness and shortness of breath. Negative for cough and wheezing.   Cardiovascular:  Positive for chest pain and palpitations. Negative for claudication, leg swelling, syncope (2 weeks ago had an episode) and near-syncope.  Gastrointestinal:  Negative for abdominal pain, constipation, diarrhea, nausea and vomiting.  Genitourinary:  Negative for dysuria and flank pain.  Musculoskeletal:  Negative for back pain and neck pain.  Skin:  Negative for wound.  Neurological:  Negative for light-headedness and headaches.  Psychiatric/Behavioral:  Negative for agitation.   All other systems reviewed and are negative.   Physical Exam Updated Vital Signs BP 118/87 (BP Location: Left Arm)   Pulse 88   Temp 97.8 F (36.6 C)   Resp 20   Ht 5\' 5"  (1.651 m)   Wt 59 kg   LMP 08/06/2023 (Exact Date)   SpO2 98%   BMI 21.63 kg/m  Physical Exam Vitals and nursing note reviewed.  Constitutional:      General: She is not in acute distress.  Appearance: She is well-developed. She is not ill-appearing, toxic-appearing or diaphoretic.  HENT:     Head: Normocephalic and atraumatic.  Eyes:     Conjunctiva/sclera: Conjunctivae normal.     Pupils: Pupils are equal, round, and reactive to light.  Cardiovascular:     Rate and Rhythm: Normal rate and regular rhythm.     Heart sounds: Normal heart sounds. No murmur heard. Pulmonary:     Effort: Pulmonary effort is normal. No  tachypnea or respiratory distress.     Breath sounds: Normal breath sounds. No decreased breath sounds, wheezing, rhonchi or rales.  Chest:     Chest wall: No tenderness.  Abdominal:     Palpations: Abdomen is soft.     Tenderness: There is no abdominal tenderness.  Musculoskeletal:        General: No swelling.     Cervical back: Neck supple.     Right lower leg: No tenderness. No edema.     Left lower leg: No tenderness. No edema.  Skin:    General: Skin is warm and dry.     Capillary Refill: Capillary refill takes less than 2 seconds.     Findings: No erythema.  Neurological:     General: No focal deficit present.     Mental Status: She is alert.  Psychiatric:        Mood and Affect: Mood normal.     ED Results / Procedures / Treatments   Labs (all labs ordered are listed, but only abnormal results are displayed) Labs Reviewed  BASIC METABOLIC PANEL - Abnormal; Notable for the following components:      Result Value   Sodium 134 (*)    Potassium 3.4 (*)    All other components within normal limits  CBC  PREGNANCY, URINE  D-DIMER, QUANTITATIVE  TROPONIN I (HIGH SENSITIVITY)  TROPONIN I (HIGH SENSITIVITY)    EKG EKG Interpretation Date/Time:  Tuesday September 08 2023 21:01:18 EST Ventricular Rate:  87 PR Interval:  120 QRS Duration:  84 QT Interval:  368 QTC Calculation: 442 R Axis:   95  Text Interpretation: Normal sinus rhythm Rightward axis T wave abnormality, consider inferior ischemia Abnormal ECG When compared with ECG of 13-Feb-2022 09:44, PREVIOUS ECG IS PRESENT when compared to prior, overall similar appearance. No STEMI Confirmed by Theda Belfast ([id number]) on 09/08/2023 9:12:16 PM  Radiology DG Chest 2 View  Result Date: 09/08/2023 CLINICAL DATA:  Left-sided chest pain and shortness of breath since this morning EXAM: CHEST - 2 VIEW COMPARISON:  08/28/2015 FINDINGS: Frontal and lateral views of the chest demonstrate an unremarkable cardiac silhouette.  No acute airspace disease, effusion, or pneumothorax. No acute bony abnormalities. IMPRESSION: 1. No acute intrathoracic process. Electronically Signed   By: Sharlet Salina M.D.   On: 09/08/2023 21:28    Procedures Procedures    Medications Ordered in ED Medications - No data to display  ED Course/ Medical Decision Making/ A&P                                 Medical Decision Making Amount and/or Complexity of Data Reviewed Labs: ordered. Radiology: ordered.    Ashlee Perez is a 17 y.o. female with a past medical history significant for asthma, bipolar disorder, migraines, depression, eczema, and migraines who presents with chest pain, shortness of breath, and recent syncope.  According to patient, about 2 weeks ago she had a syncopal episode where  she had palpitations, chest pain, and had to lay down.  She reports she stood up and then passed out.  She is scheduled to follow-up with a cardiologist and continue a cardiac workup but she suspects she may have POTS.  She does have a PFO she reports but otherwise does not have any other formal cardiac diagnoses.  She says that since this morning, she woke and was having a squeezing pain gripping her heart and then stabbing pain coming and going.  She reports a stabbing lasts for several minutes at a time and then goes away.  She has had on and off today.  She reports associated shortness of breath with it but denies any syncope today.  She reports no nausea, vomiting, or diaphoresis.  Denies any other constipation, diarrhea, or urinary changes.  No leg pain or leg swelling and denies any personal or family history of blood clots.  Denies any trauma.  Denies any family history of early heart disease.  Reports the pain is moderate to severe at times.  On exam, lungs clear.  Chest nontender.  No rash.  Abdomen nontender.  Legs nontender nonedematous.  Patient resting comfortably and well-appearing.  No focal neurologic deficits.  Vital signs  reassuring on arrival without tachycardia, tachypnea, or hypoxia.  EKG does not show STEMI then show some T wave inversion similar to prior.  Clinically I am suspicious for musculoskeletal type pain however with her report of recent syncope with chest pain and shortness of breath that she does report is pleuritic at times today, I do feel she needs workup to rule out a PE.  Will get a D-dimer and a delta troponin.  She will get other labs and chest x-ray as well.  Patient's initial troponin is negative, will trend.  Metabolic panel showed mild hypokalemia of 3.4 but otherwise did not show evidence of AKI.  CBC reassuring.  She is not pregnant.  Chest x-ray did not show acute abnormality.  If workup is reassuring, anticipate discharge home and have her follow-up with her PCP and continue outpatient workup for these episodes of lightheadedness and chest discomfort.  11:11 PM Patient's workup continues to return.  Her troponin is less than 2.  Will trend.  Her D-dimer is elevated at 1.48, will get CT PE study.  Care transferred to oncoming team to wait for delta troponin and CT PE study results.  If workup reassuring, anticipate discharge home to continue outpatient workup.         Final Clinical Impression(s) / ED Diagnoses Final diagnoses:  Atypical chest pain  Shortness of breath    Clinical Impression: 1. Atypical chest pain   2. Shortness of breath     Disposition: Care transferred to oncoming team to wait for results of repeat troponin and CT PE study.  Anticipate discharge if workup reassuring.  This note was prepared with assistance of Conservation officer, historic buildings. Occasional wrong-word or sound-a-like substitutions may have occurred due to the inherent limitations of voice recognition software.     Kashawn Manzano, Canary Brim, MD 09/08/23 [id number]

## 2023-09-08 NOTE — ED Provider Notes (Signed)
11:29 PM Assumed care from Dr. Rush Landmark, please see their note for full history, physical and decision making until this point. In brief this is a 17 y.o. year old female who presented to the ED tonight with Chest Pain     Pleurisy, syncope, dyspnea. D dimer up, needs cta. Needs second troponin.   Second troponin ok. CTA unremarkable. VSS. Asymptomatic at this time. Will continue outpatient follow up for PFO/POTS, will return for new/worsening symptoms.   Discharge instructions, including strict return precautions for new or worsening symptoms, given. Patient and/or family verbalized understanding and agreement with the plan as described.   Labs, studies and imaging reviewed by myself and considered in medical decision making if ordered. Imaging interpreted by radiology.  Labs Reviewed  BASIC METABOLIC PANEL - Abnormal; Notable for the following components:      Result Value   Sodium 134 (*)    Potassium 3.4 (*)    All other components within normal limits  D-DIMER, QUANTITATIVE - Abnormal; Notable for the following components:   D-Dimer, Quant 1.48 (*)    All other components within normal limits  CBC  PREGNANCY, URINE  TROPONIN I (HIGH SENSITIVITY)  TROPONIN I (HIGH SENSITIVITY)    DG Chest 2 View  Final Result    CT Angio Chest PE W and/or Wo Contrast    (Results Pending)    No follow-ups on file.    Marthella Osorno, Barbara Cower, MD 09/09/23 [phone]

## 2023-09-08 NOTE — ED Triage Notes (Signed)
Pt presents to the ED via POV with complaints of L sided CP and SOB that started this AM. Pt states that the pain is localized around the L side of her chest and it feels like someone is "stabbing me in the chest", the pain is intermittent in nature. She notes taking two motrin around 1800 tonight with no improvement in her sx. A&Ox4 at this time. Denies fevers, chills, abdominal pain, N/V/D.

## 2023-09-11 ENCOUNTER — Other Ambulatory Visit: Payer: Self-pay

## 2023-09-11 ENCOUNTER — Emergency Department (HOSPITAL_BASED_OUTPATIENT_CLINIC_OR_DEPARTMENT_OTHER): Payer: 59

## 2023-09-11 ENCOUNTER — Encounter (HOSPITAL_BASED_OUTPATIENT_CLINIC_OR_DEPARTMENT_OTHER): Payer: Self-pay

## 2023-09-11 DIAGNOSIS — J45909 Unspecified asthma, uncomplicated: Secondary | ICD-10-CM | POA: Insufficient documentation

## 2023-09-11 DIAGNOSIS — F32A Depression, unspecified: Secondary | ICD-10-CM | POA: Diagnosis not present

## 2023-09-11 DIAGNOSIS — F419 Anxiety disorder, unspecified: Secondary | ICD-10-CM | POA: Diagnosis not present

## 2023-09-11 DIAGNOSIS — R072 Precordial pain: Secondary | ICD-10-CM | POA: Diagnosis present

## 2023-09-11 DIAGNOSIS — Z91010 Allergy to peanuts: Secondary | ICD-10-CM | POA: Diagnosis not present

## 2023-09-11 LAB — BASIC METABOLIC PANEL
Anion gap: 7 (ref 5–15)
BUN: 14 mg/dL (ref 6–20)
CO2: 23 mmol/L (ref 22–32)
Calcium: 9.1 mg/dL (ref 8.9–10.3)
Chloride: 106 mmol/L (ref 98–111)
Creatinine, Ser: 0.73 mg/dL (ref 0.44–1.00)
GFR, Estimated: >60 mL/min (ref >60)
Glucose, Bld: 102 mg/dL — ABNORMAL HIGH (ref 70–99)
Potassium: 3.8 mmol/L (ref 3.5–5.1)
Sodium: 136 mmol/L (ref 135–145)

## 2023-09-11 LAB — CBC
HCT: 39.4 % (ref 36.0–46.0)
Hemoglobin: 13.4 g/dL (ref 12.0–15.0)
MCH: 28.2 pg (ref 26.0–34.0)
MCHC: 34 g/dL (ref 30.0–36.0)
MCV: 82.8 fL (ref 80.0–100.0)
Platelets: 318 10*3/uL (ref 150–400)
RBC: 4.76 MIL/uL (ref 3.87–5.11)
RDW: 12.4 % (ref 11.5–15.5)
WBC: 7.2 10*3/uL (ref 4.0–10.5)
nRBC: 0 % (ref 0.0–0.2)

## 2023-09-11 LAB — HCG, SERUM, QUALITATIVE: Preg, Serum: NEGATIVE

## 2023-09-11 LAB — TROPONIN I (HIGH SENSITIVITY): Troponin I (High Sensitivity): <2 ng/L (ref <18)

## 2023-09-11 MED ORDER — IOHEXOL 350 MG/ML SOLN
75.0000 mL | Freq: Once | INTRAVENOUS | Status: AC | PRN
  Administered 2023-09-11: 75 mL via INTRAVENOUS

## 2023-09-11 NOTE — ED Triage Notes (Signed)
Pt to triage c/o chest pain 7/10 sharp in nature x 4 hours. Pt reports SOB with Nausea and was seen Tuesday for same. PT presents crying and anxious.  EKG completed, labs drawn VSS NAD PT on room air.

## 2023-09-12 ENCOUNTER — Emergency Department (HOSPITAL_BASED_OUTPATIENT_CLINIC_OR_DEPARTMENT_OTHER)
Admission: EM | Admit: 2023-09-12 | Discharge: 2023-09-12 | Disposition: A | Discharge status: Home / Self Care | Payer: 59 | Attending: Emergency Medicine | Admitting: Emergency Medicine

## 2023-09-12 DIAGNOSIS — R072 Precordial pain: Secondary | ICD-10-CM

## 2023-09-12 MED ORDER — NAPROXEN 500 MG PO TABS: 500.0000 mg | ORAL_TABLET | Freq: Two times a day (BID) | ORAL | 0 refills | Status: DC

## 2023-09-12 MED ORDER — KETOROLAC TROMETHAMINE 30 MG/ML IJ SOLN
15.0000 mg | Freq: Once | INTRAMUSCULAR | Status: AC
Start: 2023-09-12 — End: 2023-09-12
  Administered 2023-09-12: 15 mg via INTRAVENOUS
  Filled 2023-09-12: qty 1

## 2023-09-12 MED ORDER — ALUM & MAG HYDROXIDE-SIMETH 200-200-20 MG/5ML PO SUSP
30.0000 mL | Freq: Once | ORAL | Status: AC
  Administered 2023-09-12: 30 mL via ORAL
  Filled 2023-09-12: qty 30

## 2023-09-12 NOTE — ED Provider Notes (Signed)
[place] [facility] AT [facility] Provider Note   CSN: [id number] Arrival date & time: 09/11/23  2220     History  Chief Complaint  Patient presents with   Chest Pain    Ashlee Perez is a 17 y.o. female.  The history is provided by the patient.  Chest Pain Pain location:  Substernal area Pain quality: sharp   Pain radiates to:  Does not radiate Pain severity:  Moderate Onset quality:  Sudden Duration:  5 hours Timing:  Constant Progression:  Unchanged Chronicity:  New Context: at rest   Context: not lifting and not trauma   Relieved by:  Nothing Worsened by:  Nothing Ineffective treatments:  None tried Associated symptoms: no altered mental status, no anorexia, no claudication, no cough, no dysphagia, no fatigue, no fever, no headache, no heartburn, no lower extremity edema, no nausea, no near-syncope, no numbness, no orthopnea, no palpitations, no shortness of breath, no syncope, no vomiting and no weakness   Risk factors: no coronary artery disease, not female, not obese, not pregnant and no prior DVT/PE   Patient with anxiety and depression with chest pain.  Seen recently for same with negative work up including negative CTA.      Past Medical History:  Diagnosis Date   Allergy    Anxiety    Asthma    prn inhaler   Depression    Eczema    Migraine with aura    Nasal bone fx-closed 06/23/2012   has a cut on nose from the injury     Home Medications Prior to Admission medications   Medication Sig Start Date End Date Taking? Authorizing Provider  naproxen (NAPROSYN) 500 MG tablet Take 1 tablet (500 mg total) by mouth 2 (two) times daily. 09/12/23  Yes Jennamarie Goings, MD  albuterol (VENTOLIN HFA) 108 (90 Base) MCG/ACT inhaler Inhale 2 puffs into the lungs every 6 (six) hours as needed. Patient taking differently: Inhale 2 puffs into the lungs every 6 (six) hours as needed for wheezing or shortness of breath. 02/26/21   Alfonse Spruce, MD  cariprazine (VRAYLAR) 1.5 MG capsule Take 1 capsule (1.5 mg total) by mouth daily. 08/18/23   Mozingo, Thereasa Solo, NP  EPINEPHrine (EPIPEN 2-PAK) 0.3 mg/0.3 mL IJ SOAJ injection Inject 0.3 mLs (0.3 mg total) into the muscle once. As needed for severe life-threatening allergic reaction Patient taking differently: Inject 0.3 mg into the muscle once as needed for anaphylaxis (severe life-threatening allergic reaction). 08/15/15   Kozlow, Alvira Philips, MD  mometasone (ELOCON) 0.1 % cream Apply 1 Application topically daily. 03/11/23   Hetty Blend, FNP  norethindrone (MICRONOR) 0.35 MG tablet Take 1 tablet by mouth daily.    [provider]      Allergies    Other, Peanut-containing drug products, Penicillins, and Serotonin reuptake inhibitors (ssris)    Review of Systems   Review of Systems  Constitutional:  Negative for fatigue and fever.  HENT:  Negative for trouble swallowing.   Respiratory:  Negative for cough and shortness of breath.   Cardiovascular:  Positive for chest pain. Negative for palpitations, orthopnea, claudication, leg swelling, syncope and near-syncope.  Gastrointestinal:  Negative for anorexia, heartburn, nausea and vomiting.  Neurological:  Negative for weakness, numbness and headaches.  All other systems reviewed and are negative.   Physical Exam Updated Vital Signs BP (!) 105/56   Pulse 76   Temp 98.4 F (36.9 C)   Resp 15   Wt  59 kg   LMP 08/06/2023 (Exact Date)   SpO2 99%   BMI 21.64 kg/m  Physical Exam Vitals and nursing note reviewed.  Constitutional:      General: She is not in acute distress.    Appearance: She is well-developed.  HENT:     Head: Normocephalic and atraumatic.     Nose: Nose normal.  Eyes:     Pupils: Pupils are equal, round, and reactive to light.  Cardiovascular:     Rate and Rhythm: Normal rate and regular rhythm.     Pulses: Normal pulses.     Heart sounds: Normal heart sounds.  Pulmonary:     Effort:  Pulmonary effort is normal. No respiratory distress.     Breath sounds: Normal breath sounds.  Abdominal:     General: Bowel sounds are normal. There is no distension.     Palpations: Abdomen is soft.     Tenderness: There is no abdominal tenderness. There is no guarding or rebound.  Musculoskeletal:        General: Normal range of motion.     Cervical back: Neck supple.  Skin:    General: Skin is warm and dry.     Capillary Refill: Capillary refill takes less than 2 seconds.     Findings: No erythema or rash.  Neurological:     General: No focal deficit present.     Mental Status: She is oriented to person, place, and time.     Deep Tendon Reflexes: Reflexes normal.  Psychiatric:        Mood and Affect: Mood normal.     ED Results / Procedures / Treatments   Labs (all labs ordered are listed, but only abnormal results are displayed) Results for orders placed or performed during the hospital encounter of 09/12/23  Basic metabolic panel   Collection Time: 09/11/23 10:34 PM  Result Value Ref Range   Sodium 136 135 - 145 mmol/L   Potassium 3.8 3.5 - 5.1 mmol/L   Chloride 106 98 - 111 mmol/L   CO2 23 22 - 32 mmol/L   Glucose, Bld 102 (H) 70 - 99 mg/dL   BUN 14 6 - 20 mg/dL   Creatinine, Ser 1.61 0.44 - 1.00 mg/dL   Calcium 9.1 8.9 - 09.6 mg/dL   GFR, Estimated >04 >54 mL/min   Anion gap 7 5 - 15  CBC   Collection Time: 09/11/23 10:34 PM  Result Value Ref Range   WBC 7.2 4.0 - 10.5 K/uL   RBC 4.76 3.87 - 5.11 MIL/uL   Hemoglobin 13.4 12.0 - 15.0 g/dL   HCT 09.8 11.9 - 14.7 %   MCV 82.8 80.0 - 100.0 fL   MCH 28.2 26.0 - 34.0 pg   MCHC 34.0 30.0 - 36.0 g/dL   RDW 82.9 56.2 - 13.0 %   Platelets 318 150 - 400 K/uL   nRBC 0.0 0.0 - 0.2 %  hCG, serum, qualitative   Collection Time: 09/11/23 10:34 PM  Result Value Ref Range   Preg, Serum NEGATIVE NEGATIVE  Troponin I (High Sensitivity)   Collection Time: 09/11/23 10:34 PM  Result Value Ref Range   Troponin I (High  Sensitivity) <2 <18 ng/L   CT Angio Chest PE W and/or Wo Contrast Result Date: 09/12/2023 CLINICAL DATA:  Sharp chest pain for 4 hours, SOB, nausea. EXAM: CT ANGIOGRAPHY CHEST WITH CONTRAST TECHNIQUE: Multidetector CT imaging of the chest was performed using the standard protocol during bolus administration of intravenous contrast.  Multiplanar CT image reconstructions and MIPs were obtained to evaluate the vascular anatomy. RADIATION DOSE REDUCTION: This exam was performed according to the departmental dose-optimization program which includes automated exposure control, adjustment of the mA and/or kV according to patient size and/or use of iterative reconstruction technique. CONTRAST:  75mL OMNIPAQUE IOHEXOL 350 MG/ML SOLN COMPARISON:  09/08/2023. FINDINGS: Cardiovascular: Satisfactory opacification of the pulmonary arteries to the segmental level. No evidence of pulmonary embolism. Normal heart size. No pericardial effusion. Mediastinum/Nodes: No enlarged mediastinal, hilar, or axillary lymph nodes. Thyroid gland, trachea, and esophagus demonstrate no significant findings. Lungs/Pleura: Lungs are clear. No pleural effusion or pneumothorax. Upper Abdomen: No acute abnormality. Musculoskeletal: No chest wall abnormality. No acute or significant osseous findings. Review of the MIP images confirms the above findings. IMPRESSION: No evidence of PE or other acute chest process. Electronically Signed   By: Layla Maw M.D.   On: 09/12/2023 00:02   DG Chest 2 View Result Date: 09/11/2023 CLINICAL DATA:  chest pain EXAM: CHEST - 2 VIEW COMPARISON:  09/08/2023. FINDINGS: Cardiac silhouette is unremarkable. No pneumothorax or pleural effusion. The lungs are clear. The visualized skeletal structures are unremarkable. IMPRESSION: No acute cardiopulmonary process. Electronically Signed   By: Layla Maw M.D.   On: 09/11/2023 23:50   CT Angio Chest PE W and/or Wo Contrast Result Date: 09/08/2023 CLINICAL  DATA:  Left chest pain, shortness of breath, evaluate for PE EXAM: CT ANGIOGRAPHY CHEST WITH CONTRAST TECHNIQUE: Multidetector CT imaging of the chest was performed using the standard protocol during bolus administration of intravenous contrast. Multiplanar CT image reconstructions and MIPs were obtained to evaluate the vascular anatomy. RADIATION DOSE REDUCTION: This exam was performed according to the departmental dose-optimization program which includes automated exposure control, adjustment of the mA and/or kV according to patient size and/or use of iterative reconstruction technique. CONTRAST:  75mL OMNIPAQUE IOHEXOL 350 MG/ML SOLN COMPARISON:  Chest radiograph dated 09/08/2023 FINDINGS: Cardiovascular: Satisfactory opacification the bilateral pulmonary arteries to the segmental level. No evidence of pulmonary embolism. Study is not tailored for evaluation of the thoracic aorta. No evidence of thoracic aortic aneurysm. Heart is normal in size.  No pericardial effusion. Mediastinum/Nodes: No suspicious mediastinal lymphadenopathy. Visualized thyroid is unremarkable. Lungs/Pleura: Lungs are clear. No suspicious pulmonary nodules No focal consolidation or aspiration. No pleural effusion or pneumothorax. Upper Abdomen: Visualized upper abdomen is grossly unremarkable. Musculoskeletal: Visualized osseous structures are within normal limits. Review of the MIP images confirms the above findings. IMPRESSION: No evidence of pulmonary embolism. Normal CT chest. Electronically Signed   By: Charline Bills M.D.   On: 09/08/2023 23:49   DG Chest 2 View Result Date: 09/08/2023 CLINICAL DATA:  Left-sided chest pain and shortness of breath since this morning EXAM: CHEST - 2 VIEW COMPARISON:  08/28/2015 FINDINGS: Frontal and lateral views of the chest demonstrate an unremarkable cardiac silhouette. No acute airspace disease, effusion, or pneumothorax. No acute bony abnormalities. IMPRESSION: 1. No acute intrathoracic  process. Electronically Signed   By: Sharlet Salina M.D.   On: 09/08/2023 21:28     EKG EKG Interpretation Date/Time:  Friday September 11 2023 22:29:33 EST Ventricular Rate:  98 PR Interval:  122 QRS Duration:  82 QT Interval:  346 QTC Calculation: 441 R Axis:   88  Text Interpretation: Normal sinus rhythm Nonspecific T wave abnormality Confirmed by Rahshawn Remo ([id number]) on 09/11/2023 11:01:46 PM  Radiology CT Angio Chest PE W and/or Wo Contrast Result Date: 09/12/2023 CLINICAL DATA:  Sharp chest pain for  4 hours, SOB, nausea. EXAM: CT ANGIOGRAPHY CHEST WITH CONTRAST TECHNIQUE: Multidetector CT imaging of the chest was performed using the standard protocol during bolus administration of intravenous contrast. Multiplanar CT image reconstructions and MIPs were obtained to evaluate the vascular anatomy. RADIATION DOSE REDUCTION: This exam was performed according to the departmental dose-optimization program which includes automated exposure control, adjustment of the mA and/or kV according to patient size and/or use of iterative reconstruction technique. CONTRAST:  75mL OMNIPAQUE IOHEXOL 350 MG/ML SOLN COMPARISON:  09/08/2023. FINDINGS: Cardiovascular: Satisfactory opacification of the pulmonary arteries to the segmental level. No evidence of pulmonary embolism. Normal heart size. No pericardial effusion. Mediastinum/Nodes: No enlarged mediastinal, hilar, or axillary lymph nodes. Thyroid gland, trachea, and esophagus demonstrate no significant findings. Lungs/Pleura: Lungs are clear. No pleural effusion or pneumothorax. Upper Abdomen: No acute abnormality. Musculoskeletal: No chest wall abnormality. No acute or significant osseous findings. Review of the MIP images confirms the above findings. IMPRESSION: No evidence of PE or other acute chest process. Electronically Signed   By: Layla Maw M.D.   On: 09/12/2023 00:02   DG Chest 2 View Result Date: 09/11/2023 CLINICAL DATA:  chest pain  EXAM: CHEST - 2 VIEW COMPARISON:  09/08/2023. FINDINGS: Cardiac silhouette is unremarkable. No pneumothorax or pleural effusion. The lungs are clear. The visualized skeletal structures are unremarkable. IMPRESSION: No acute cardiopulmonary process. Electronically Signed   By: Layla Maw M.D.   On: 09/11/2023 23:50    Procedures Procedures    Medications Ordered in ED Medications  iohexol (OMNIPAQUE) 350 MG/ML injection 75 mL (75 mLs Intravenous Contrast Given 09/11/23 2346)  ketorolac (TORADOL) 30 MG/ML injection 15 mg (15 mg Intravenous Given 09/12/23 0021)  alum & mag hydroxide-simeth (MAALOX/MYLANTA) 200-200-20 MG/5ML suspension 30 mL (30 mLs Oral Given 09/12/23 0020)    ED Course/ Medical Decision Making/ A&P                                 Medical Decision Making Patient with chest pain recurrent   Amount and/or Complexity of Data Reviewed External Data Reviewed: labs, radiology, ECG and notes.    Details: Previous ED visit reviewed  Labs: ordered.    Details: Normal sodium 136, normal potassium 3.8, normal creatinine.  Negative pregnancy test.  Negative troponin normal hemoglobin 13.4  Radiology: ordered and independent interpretation performed.    Details: Negative CTA ECG/medicine tests: ordered and independent interpretation performed. Decision-making details documented in ED Course.  Risk OTC drugs. Prescription drug management. Risk Details: This is neither cardiac, ruled out for MI in the ED heart score is 0 very low risk for mace.  Also ruled out x 2 this week for PE.  Stable for discharge with close follow up    Final Clinical Impression(s) / ED Diagnoses Final diagnoses:  Precordial pain   Return for intractable cough, coughing up blood, fevers > 100.4 unrelieved by medication, shortness of breath, intractable vomiting, chest pain, shortness of breath, weakness, numbness, changes in speech, facial asymmetry, abdominal pain, passing out, Inability to  tolerate liquids or food, cough, altered mental status or any concerns. No signs of systemic illness or infection. The patient is nontoxic-appearing on exam and vital signs are within normal limits.  I have reviewed the triage vital signs and the nursing notes. Pertinent labs & imaging results that were available during my care of the patient were reviewed by me and considered in my medical decision making (see  chart for details). After history, exam, and medical workup I feel the patient has been appropriately medically screened and is safe for discharge home. Pertinent diagnoses were discussed with the patient. Patient was given return precautions.    Rx / DC Orders ED Discharge Orders          Ordered    naproxen (NAPROSYN) 500 MG tablet  2 times daily        09/12/23 0243              Jenney Brester, MD 09/12/23 [id number]

## 2023-10-13 ENCOUNTER — Ambulatory Visit: Payer: 59 | Admitting: Adult Health

## 2023-10-13 ENCOUNTER — Encounter: Payer: Self-pay | Admitting: Adult Health

## 2023-10-13 DIAGNOSIS — F819 Developmental disorder of scholastic skills, unspecified: Secondary | ICD-10-CM | POA: Diagnosis not present

## 2023-10-13 DIAGNOSIS — G4700 Insomnia, unspecified: Secondary | ICD-10-CM | POA: Diagnosis not present

## 2023-10-13 DIAGNOSIS — F319 Bipolar disorder, unspecified: Secondary | ICD-10-CM

## 2023-10-13 DIAGNOSIS — F411 Generalized anxiety disorder: Secondary | ICD-10-CM

## 2023-10-13 MED ORDER — DESVENLAFAXINE SUCCINATE ER 50 MG PO TB24: 50.0000 mg | ORAL_TABLET | Freq: Every day | ORAL | 2 refills | Status: DC

## 2023-10-13 MED ORDER — CARIPRAZINE HCL 1.5 MG PO CAPS: 1.5000 mg | ORAL_CAPSULE | Freq: Every day | ORAL | 2 refills | Status: DC

## 2023-10-13 NOTE — Progress Notes (Signed)
 Ashlee Perez [id number] 2006-04-20 18 y.o.  Subjective:   Patient ID:  Ashlee Perez is a 18 y.o. (DOB 11/11/2005) female.  Chief Complaint: No chief complaint on file.   HPI Ashlee Perez presents to the office today for follow-up of BPD, anxiety, learning disorder, and insomnia.  Describes mood today as ok. Pleasant. Reports tearfulness. Mood symptoms - reports increased depression. Reports irritability - spikes at times. Reports decreased anxiety. Denies recent panic attacks. Reports some worry and over thinking. Denies rumination. Mood is lower. Stating I feel like my mood declines with my physical health, now it's effecting my mood. Feels like the Vraylar  is helpful, but would like to consider other options. Reports decreased interest and motivation. Taking medications as prescribed.  Energy levels lower. Active, does not have a regular exercise routine.  Enjoys some usual interests and activities. Lives with boyfriend. Parents and sister are in [place] . Spending time with family.  Appetite adequate. Weight stable - 125 pounds - 65.  Sleep is variable. Averages 4 to 10 hours. Reports daytime napping. Focus and concentration decent. Completing tasks. Managing aspects of household. Received high school diploma. Unemployed. Denies SI or HI.  Denies AH or VH. Denies self harm.  Denies substance use.   Previous medications: Risperdal , Latuda , Vraylar , Abilify    Flowsheet Row ED from 09/12/2023 in [facility] ED from 09/08/2023 in [facility] Department at [facility] ED from 02/12/2022 in  [facility]  C-SSRS RISK CATEGORY No Risk No Risk High Risk        Review of Systems:  Review of Systems  Musculoskeletal:  Negative for gait problem.  Neurological:  Negative for tremors.  Psychiatric/Behavioral:         Please refer to HPI    Medications: I have  reviewed the patient's current medications.  Current Outpatient Medications  Medication Sig Dispense Refill   albuterol  (VENTOLIN  HFA) 108 (90 Base) MCG/ACT inhaler Inhale 2 puffs into the lungs every 6 (six) hours as needed. (Patient taking differently: Inhale 2 puffs into the lungs every 6 (six) hours as needed for wheezing or shortness of breath.) 1 each 1   cariprazine  (VRAYLAR ) 1.5 MG capsule Take 1 capsule (1.5 mg total) by mouth daily. 30 capsule 2   EPINEPHrine  (EPIPEN  2-PAK) 0.3 mg/0.3 mL IJ SOAJ injection Inject 0.3 mLs (0.3 mg total) into the muscle once. As needed for severe life-threatening allergic reaction (Patient taking differently: Inject 0.3 mg into the muscle once as needed for anaphylaxis (severe life-threatening allergic reaction).) 2 Device 2   mometasone  (ELOCON ) 0.1 % cream Apply 1 Application topically daily. 45 g 3   naproxen  (NAPROSYN ) 500 MG tablet Take 1 tablet (500 mg total) by mouth 2 (two) times daily. 14 tablet 0   norethindrone (MICRONOR) 0.35 MG tablet Take 1 tablet by mouth daily.     No current facility-administered medications for this visit.    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   Please see review of systems for further details on the patient's review from today.   Objective:   Physical Exam:  There were no vitals taken for this  visit.  Physical Exam Constitutional:      General: She is not in acute distress. Musculoskeletal:        General: No deformity.  Neurological:     Mental Status: She is alert and oriented to person, place, and time.     Coordination: Coordination normal.  Psychiatric:        Attention and Perception: Attention and perception normal. She does not perceive auditory or visual hallucinations.        Mood and Affect: Mood normal. Mood is not anxious or depressed. Affect is not labile, blunt, angry or inappropriate.        Speech: Speech normal.        Behavior: Behavior normal.        Thought Content: Thought content normal. Thought content is not paranoid or delusional. Thought content does not include homicidal or suicidal ideation. Thought content does not include homicidal or suicidal plan.        Cognition and Memory: Cognition and memory normal.        Judgment: Judgment normal.     Comments: Insight intact     Lab Review:     Component Value Date/Time   NA 136 09/11/2023 2234   NA 145 (H) 08/16/2015 0000   K 3.8 09/11/2023 2234   CL 106 09/11/2023 2234   CO2 23 09/11/2023 2234   GLUCOSE 102 (H) 09/11/2023 2234   BUN 14 09/11/2023 2234   BUN 9 08/16/2015 0000   CREATININE 0.73 09/11/2023 2234   CALCIUM 9.1 09/11/2023 2234   PROT 7.2 02/12/2022 2348   PROT 7.1 08/16/2015 0000   ALBUMIN 4.0 02/12/2022 2348   ALBUMIN 4.4 08/16/2015 0000   AST 21 02/12/2022 2348   ALT 12 02/12/2022 2348   ALKPHOS 67 02/12/2022 2348   BILITOT 0.9 02/12/2022 2348   BILITOT 0.3 08/16/2015 0000   GFRNONAA >60 09/11/2023 2234   GFRAA NOT CALCULATED 04/24/2020 0526       Component Value Date/Time   WBC 7.2 09/11/2023 2234   RBC 4.76 09/11/2023 2234   HGB 13.4 09/11/2023 2234   HGB 13.4 08/16/2015 0000   HCT 39.4 09/11/2023 2234   HCT 38.9 08/16/2015 0000   PLT 318 09/11/2023 2234   PLT 354 08/16/2015 0000   MCV 82.8 09/11/2023 2234   MCV 77 08/16/2015 0000   MCH 28.2 09/11/2023  2234   MCHC 34.0 09/11/2023 2234   RDW 12.4 09/11/2023 2234   RDW 14.1 08/16/2015 0000   LYMPHSABS 3.6 02/12/2022 2348   LYMPHSABS 3.5 08/16/2015 0000   MONOABS 1.2 02/12/2022 2348   EOSABS 0.6 02/12/2022 2348   EOSABS 1.0 (H) 08/16/2015 0000   BASOSABS 0.0 02/12/2022 2348   BASOSABS 0.0 08/16/2015 0000    No results found for: POCLITH, LITHIUM   No results found for: PHENYTOIN, PHENOBARB, VALPROATE, CBMZ   .res Assessment: Plan:    Plan:  Continue Vraylar  1.5mg  daily   Consdier Lithium 150mg  daily for depression  Will call for emotional support animal letter if needed.  Patient advised to contact office with any  questions, adverse effects, or acute worsening in signs and symptoms.  RTC 3 months  There are no diagnoses linked to this encounter.   Please see After Visit Summary for patient specific instructions.  No future appointments.  No orders of the defined types were placed in this encounter.   -------------------------------

## 2023-10-21 ENCOUNTER — Other Ambulatory Visit: Payer: Self-pay

## 2023-10-21 ENCOUNTER — Telehealth: Payer: Self-pay

## 2023-10-21 MED ORDER — BUPROPION HCL ER (XL) 150 MG PO TB24: 150.0000 mg | ORAL_TABLET | Freq: Every day | ORAL | 0 refills | Status: DC

## 2023-10-21 NOTE — Telephone Encounter (Signed)
Almira Coaster had asked me to discuss with patient taking one of the following medications:  Lamictal - told would need to use extra protection Increasing Vraylar from 1.6 Or restarting Wellbutrin.   She elected to try Wellbutrin and Rx sent for a 30-day supply.

## 2023-10-23 ENCOUNTER — Emergency Department (HOSPITAL_BASED_OUTPATIENT_CLINIC_OR_DEPARTMENT_OTHER)
Admission: EM | Admit: 2023-10-23 | Discharge: 2023-10-23 | Disposition: A | Discharge status: Home / Self Care | Payer: 59 | Attending: Emergency Medicine | Admitting: Emergency Medicine

## 2023-10-23 ENCOUNTER — Emergency Department (HOSPITAL_BASED_OUTPATIENT_CLINIC_OR_DEPARTMENT_OTHER): Payer: 59

## 2023-10-23 ENCOUNTER — Other Ambulatory Visit: Payer: Self-pay

## 2023-10-23 DIAGNOSIS — Z91010 Allergy to peanuts: Secondary | ICD-10-CM | POA: Diagnosis not present

## 2023-10-23 DIAGNOSIS — J45909 Unspecified asthma, uncomplicated: Secondary | ICD-10-CM | POA: Diagnosis not present

## 2023-10-23 DIAGNOSIS — M79643 Pain in unspecified hand: Secondary | ICD-10-CM

## 2023-10-23 DIAGNOSIS — M25511 Pain in right shoulder: Secondary | ICD-10-CM | POA: Diagnosis present

## 2023-10-23 DIAGNOSIS — W01198A Fall on same level from slipping, tripping and stumbling with subsequent striking against other object, initial encounter: Secondary | ICD-10-CM | POA: Diagnosis not present

## 2023-10-23 DIAGNOSIS — M25531 Pain in right wrist: Secondary | ICD-10-CM | POA: Insufficient documentation

## 2023-10-23 DIAGNOSIS — W19XXXA Unspecified fall, initial encounter: Secondary | ICD-10-CM

## 2023-10-23 DIAGNOSIS — M79641 Pain in right hand: Secondary | ICD-10-CM | POA: Insufficient documentation

## 2023-10-23 MED ORDER — IBUPROFEN 800 MG PO TABS
800.0000 mg | ORAL_TABLET | Freq: Once | ORAL | Status: AC
  Administered 2023-10-23: 800 mg via ORAL
  Filled 2023-10-23: qty 1

## 2023-10-23 NOTE — Discharge Instructions (Signed)
It was a pleasure taking part in your care.  As we discussed, the x-rays of your wrist, hand and shoulder are negative for any Acute fractures.  Please remain in the shoulder sling until seen by orthopedics.  You may remove the shoulder sling to shower, you may remove the wrist brace to shower.  Please take ibuprofen or Tylenol every 6 hours as needed.  Please read the attached guide concerning RICE therapy.  Follow-up with orthopedics, Dr. Christell Constant, by calling his office to make an appointment.  Please return to the ED with any new or worsening symptoms.

## 2023-10-23 NOTE — ED Provider Notes (Signed)
[facility] Provider Note   CSN: [id number] Arrival date & time: 10/23/23  4098     History  Chief Complaint  Patient presents with   Marletta Lor    Ashlee Perez is a 18 y.o. female with medical history of migraines, eczema, asthma, anxiety.  The patient presents to the ED after having mechanical fall.  Patient reports that this morning she was up walking around her apartment when she tripped over a cat toy and had a ground-level fall landing on her right upper extremity.  She reports pain in her right wrist, snuffbox tenderness, right shoulder.  She denies hitting her head, losing consciousness.  She denies taking blood thinning medications.  She denies a history of prior surgeries to her right shoulder, right elbow or right wrist.  Denies medications prior to arrival.   [facility] Medications Prior to Admission medications   Medication Sig Start Date End Date Taking? Authorizing Provider  albuterol (VENTOLIN HFA) 108 (90 Base) MCG/ACT inhaler Inhale 2 puffs into the lungs every 6 (six) hours as needed. Patient taking differently: Inhale 2 puffs into the lungs every 6 (six) hours as needed for wheezing or shortness of breath. 02/26/21   Alfonse Spruce, MD  buPROPion (WELLBUTRIN XL) 150 MG 24 hr tablet Take 1 tablet (150 mg total) by mouth daily. 10/21/23   Mozingo, Thereasa Solo, NP  cariprazine (VRAYLAR) 1.5 MG capsule Take 1 capsule (1.5 mg total) by mouth daily. 10/13/23   Mozingo, Thereasa Solo, NP  desvenlafaxine (PRISTIQ) 50 MG 24 hr tablet Take 1 tablet (50 mg total) by mouth daily. 10/13/23   Mozingo, Thereasa Solo, NP  EPINEPHrine (EPIPEN 2-PAK) 0.3 mg/0.3 mL IJ SOAJ injection Inject 0.3 mLs (0.3 mg total) into the muscle once. As needed for severe life-threatening allergic reaction Patient taking differently: Inject 0.3 mg into the muscle once as needed for anaphylaxis (severe life-threatening allergic reaction).  08/15/15   Kozlow, Alvira Philips, MD  mometasone (ELOCON) 0.1 % cream Apply 1 Application topically daily. 03/11/23   Hetty Blend, FNP  naproxen (NAPROSYN) 500 MG tablet Take 1 tablet (500 mg total) by mouth 2 (two) times daily. 09/12/23   Palumbo, April, MD  norethindrone (MICRONOR) 0.35 MG tablet Take 1 tablet by mouth daily.    [provider]      Allergies    Other, Peanut-containing drug products, Penicillins, and Serotonin reuptake inhibitors (ssris)    Review of Systems   Review of Systems  Musculoskeletal:  Positive for arthralgias and myalgias.  All other systems reviewed and are negative.   Physical Exam Updated Vital Signs BP 124/79 (BP Location: Left Arm)   Pulse 84   Temp 98.1 F (36.7 C) (Oral)   Resp 18   SpO2 98%  Physical Exam Vitals and nursing note reviewed.  Constitutional:      General: She is not in acute distress.    Appearance: Normal appearance. She is not ill-appearing, toxic-appearing or diaphoretic.  HENT:     Head: Normocephalic and atraumatic.     Nose: Nose normal.     Mouth/Throat:     Mouth: Mucous membranes are moist.     Pharynx: Oropharynx is clear.  Eyes:     Extraocular Movements: Extraocular movements intact.     Conjunctiva/sclera: Conjunctivae normal.     Pupils: Pupils are equal, round, and reactive to light.  Cardiovascular:     Rate and Rhythm: Normal rate  and regular rhythm.  Pulmonary:     Effort: Pulmonary effort is normal.     Breath sounds: Normal breath sounds. No wheezing.  Abdominal:     General: Abdomen is flat. Bowel sounds are normal.     Palpations: Abdomen is soft.     Tenderness: There is no abdominal tenderness.  Musculoskeletal:     Right shoulder: Tenderness present. No swelling or deformity. Decreased range of motion.     Right elbow: Normal.     Right wrist: Tenderness and snuff box tenderness present. No swelling, deformity or lacerations. Normal range of motion.     Cervical back: Normal range of  motion and neck supple. No tenderness.     Comments: Patient with full range of motion R of wrist however does have snuffbox tenderness.  Full range of motion of right elbow.  Patient with reduced range of motion to right shoulder secondary to pain.  No obvious deformity to right shoulder.    Skin:    General: Skin is warm and dry.     Capillary Refill: Capillary refill takes less than 2 seconds.  Neurological:     Mental Status: She is alert and oriented to person, place, and time.     ED Results / Procedures / Treatments   Labs (all labs ordered are listed, but only abnormal results are displayed) Labs Reviewed - No data to display  EKG None  Radiology DG Wrist Complete Right Result Date: 10/23/2023 CLINICAL DATA:  Fall.  Wrist pain. EXAM: RIGHT WRIST - COMPLETE 3+ VIEW COMPARISON:  None Available. FINDINGS: No acute fracture or dislocation. No aggressive osseous lesion. No arthritis of imaged joints. No radiopaque foreign bodies. Soft tissues are within normal limits. IMPRESSION: *No acute osseous abnormality of the right wrist. Electronically Signed   By: Jules Schick M.D.   On: 10/23/2023 10:35   DG Shoulder Right Result Date: 10/23/2023 CLINICAL DATA:  18 year old female status post fall with pain. EXAM: RIGHT SHOULDER - 2+ VIEW COMPARISON:  Chest radiographs 09/11/2023. FINDINGS: Bone mineralization is within normal limits. There is no evidence of fracture or dislocation. There is no evidence of arthropathy or other focal bone abnormality. Negative visible right ribs and chest. IMPRESSION: Negative. Electronically Signed   By: Odessa Fleming M.D.   On: 10/23/2023 10:28   DG Hand 2 View Right Result Date: 10/23/2023 CLINICAL DATA:  18 year old female status post fall with pain. EXAM: RIGHT HAND - 2 VIEW COMPARISON:  Right wrist series today is reported separately. FINDINGS: Bone mineralization is within normal limits. Skeletally mature. There is no evidence of fracture or dislocation.  There is no evidence of arthropathy or other focal bone abnormality. No discrete soft tissue injury. IMPRESSION: No acute fracture or dislocation identified about the right hand. Electronically Signed   By: Odessa Fleming M.D.   On: 10/23/2023 10:27    Procedures Procedures   Medications Ordered in ED Medications  ibuprofen (ADVIL) tablet 800 mg (800 mg Oral Given 10/23/23 1013)    ED Course/ Medical Decision Making/ A&P  Medical Decision Making Amount and/or Complexity of Data Reviewed Radiology: ordered.  Risk Prescription drug management.   18 year old female presents to ED for evaluation.  Please see HPI for further details.  On examination patient right wrist has no obvious deformity.  She has reduced range of motion of her right wrist secondary to pain but no obvious swelling.  She allows me to passively range her right wrist.  She does have snuffbox tenderness.  2+ radial pulse of brisk capillary refill.  Elbow with full range of motion.  Compartments of forearm soft and compressible.  Patient right shoulder with no obvious deformity, swelling.  She complains of nonfocal tenderness that is "all over".  She does have reduced range of motion of her right shoulder secondary to pain.  X-ray images of patient right wrist, shoulder and hand are unremarkable.  The patient was placed into a right shoulder sling for comfort.  Provided a wrist brace.  Patient was advised that due to snuffbox tenderness she will need to follow-up with orthopedics for repeat film in 1 week.  She was provided referral to orthopedics.  Educated on RICE therapy and she voiced understanding.  Provided ibuprofen here in the department.  At this time, the patient will be discharged home.   Final Clinical Impression(s) / ED Diagnoses Final diagnoses:  Fall, initial encounter  Tenderness of anatomical snuffbox  Acute pain of right shoulder    Rx / DC Orders ED Discharge Orders     None         Clent Ridges 10/23/23 1109    Pricilla Loveless, MD 10/23/23 1432

## 2023-10-23 NOTE — ED Triage Notes (Signed)
Pt reports making her bed at 330am and fell over a cat toy and fell on right arm.  Complains of right shoulder and wrist pain.

## 2023-10-28 ENCOUNTER — Telehealth: Payer: Self-pay | Admitting: Adult Health

## 2023-10-28 NOTE — Telephone Encounter (Signed)
Called and spoke with pt. She says she needs letter asap. Complex says she needs it within 48 hours. Sent request to [employer] and [facility]

## 2023-10-28 NOTE — Telephone Encounter (Signed)
Patient called office stating that she is calling back to give information on what she needs included in ESA letter. She will need name, D.O.B. that she is seen here at [facility] and confirmed disability without disclosing diagnosis. Ph: [phone]

## 2023-11-03 ENCOUNTER — Other Ambulatory Visit: Payer: Self-pay

## 2023-11-03 ENCOUNTER — Emergency Department (HOSPITAL_COMMUNITY)
Admission: EM | Admit: 2023-11-03 | Discharge: 2023-11-03 | Discharge status: Against Medical Advice | Payer: 59 | Attending: Emergency Medicine | Admitting: Emergency Medicine

## 2023-11-03 ENCOUNTER — Encounter (HOSPITAL_COMMUNITY): Payer: Self-pay

## 2023-11-03 DIAGNOSIS — Y92410 Unspecified street and highway as the place of occurrence of the external cause: Secondary | ICD-10-CM | POA: Insufficient documentation

## 2023-11-03 DIAGNOSIS — M25522 Pain in left elbow: Secondary | ICD-10-CM | POA: Diagnosis not present

## 2023-11-03 DIAGNOSIS — M79602 Pain in left arm: Secondary | ICD-10-CM | POA: Diagnosis present

## 2023-11-03 DIAGNOSIS — M25532 Pain in left wrist: Secondary | ICD-10-CM | POA: Insufficient documentation

## 2023-11-03 DIAGNOSIS — Z5321 Procedure and treatment not carried out due to patient leaving prior to being seen by health care provider: Secondary | ICD-10-CM | POA: Insufficient documentation

## 2023-11-03 DIAGNOSIS — M25562 Pain in left knee: Secondary | ICD-10-CM | POA: Insufficient documentation

## 2023-11-03 HISTORY — DX: Postural orthostatic tachycardia syndrome (POTS): G90.A

## 2023-11-03 HISTORY — DX: Endometriosis, unspecified: N80.9
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
## 2023-11-03 NOTE — ED Notes (Signed)
Pt In lobby, reports leaving due to long wait times. Encouraged pt to stay
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
## 2023-11-10 ENCOUNTER — Encounter: Payer: Self-pay | Admitting: Adult Health

## 2023-11-10 ENCOUNTER — Telehealth: Payer: 59 | Admitting: Adult Health

## 2023-11-10 DIAGNOSIS — F319 Bipolar disorder, unspecified: Secondary | ICD-10-CM | POA: Diagnosis not present

## 2023-11-10 DIAGNOSIS — G4700 Insomnia, unspecified: Secondary | ICD-10-CM | POA: Diagnosis not present

## 2023-11-10 DIAGNOSIS — F819 Developmental disorder of scholastic skills, unspecified: Secondary | ICD-10-CM

## 2023-11-10 DIAGNOSIS — F411 Generalized anxiety disorder: Secondary | ICD-10-CM
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
## 2023-11-24 ENCOUNTER — Telehealth: Payer: 59 | Admitting: Adult Health

## 2023-12-15 ENCOUNTER — Other Ambulatory Visit: Payer: Self-pay

## 2023-12-15 ENCOUNTER — Encounter (HOSPITAL_BASED_OUTPATIENT_CLINIC_OR_DEPARTMENT_OTHER): Payer: Self-pay | Admitting: Emergency Medicine

## 2023-12-15 ENCOUNTER — Emergency Department (HOSPITAL_BASED_OUTPATIENT_CLINIC_OR_DEPARTMENT_OTHER)
Admission: EM | Admit: 2023-12-15 | Discharge: 2023-12-15 | Disposition: A | Discharge status: Home / Self Care | Attending: Emergency Medicine | Admitting: Emergency Medicine

## 2023-12-15 DIAGNOSIS — Z91010 Allergy to peanuts: Secondary | ICD-10-CM | POA: Diagnosis not present

## 2023-12-15 DIAGNOSIS — R531 Weakness: Secondary | ICD-10-CM | POA: Diagnosis present

## 2023-12-15 DIAGNOSIS — R29898 Other symptoms and signs involving the musculoskeletal system: Secondary | ICD-10-CM

## 2023-12-15 DIAGNOSIS — J45909 Unspecified asthma, uncomplicated: Secondary | ICD-10-CM | POA: Diagnosis not present

## 2023-12-15 LAB — BASIC METABOLIC PANEL
Anion gap: 8 (ref 5–15)
BUN: 12 mg/dL (ref 6–20)
CO2: 26 mmol/L (ref 22–32)
Calcium: 9.1 mg/dL (ref 8.9–10.3)
Chloride: 103 mmol/L (ref 98–111)
Creatinine, Ser: 0.65 mg/dL (ref 0.44–1.00)
GFR, Estimated: >60 mL/min (ref >60)
Glucose, Bld: 91 mg/dL (ref 70–99)
Potassium: 3.8 mmol/L (ref 3.5–5.1)
Sodium: 137 mmol/L (ref 135–145)

## 2023-12-15 LAB — CBC
HCT: 37.4 % (ref 36.0–46.0)
Hemoglobin: 12.9 g/dL (ref 12.0–15.0)
MCH: 28.4 pg (ref 26.0–34.0)
MCHC: 34.5 g/dL (ref 30.0–36.0)
MCV: 82.4 fL (ref 80.0–100.0)
Platelets: 303 10*3/uL (ref 150–400)
RBC: 4.54 MIL/uL (ref 3.87–5.11)
RDW: 12.1 % (ref 11.5–15.5)
WBC: 6.3 10*3/uL (ref 4.0–10.5)
nRBC: 0 % (ref 0.0–0.2)

## 2023-12-15 LAB — RESP PANEL BY RT-PCR (RSV, FLU A&B, COVID)  RVPGX2
Influenza A by PCR: NEGATIVE
Influenza B by PCR: NEGATIVE
Resp Syncytial Virus by PCR: NEGATIVE
SARS Coronavirus 2 by RT PCR: NEGATIVE

## 2023-12-15 NOTE — Discharge Instructions (Signed)
 You were evaluated in the emergency room for leg weakness.  Your lab work did not show any acute abnormality.  You were provided a referral for neurology.  If you experience any new or worsening symptoms please return to the emergency room.

## 2023-12-15 NOTE — ED Triage Notes (Signed)
 Pt arrived POV, reports that she has been unable to walk x 4d, denies any injury, trauma, or known etiology; can move legs, but "do not support body weight"; has some strength against resistance, hx of POTS

## 2023-12-15 NOTE — ED Provider Notes (Signed)
 [facility] Provider Note   CSN: [id number] Arrival date & time: 12/15/23  1522     History  Chief Complaint  Patient presents with   Extremity Weakness    Ashlee Perez is a 18 y.o. female presents with difficulty walking x 4 days.  No injury or trauma.  She has never had this happen before.  States she has had numbness and tingling in her legs for some time now.  Feels like it is worsening.  Does endorse chronic back pain that feels unchanged.  She says when she goes to stand she shakes and her " legs do not support her body".  Denies any unintentional weight loss, IV drug use or urinary incontinence.   Extremity Weakness      Past Medical History:  Diagnosis Date   Allergy    Anxiety    Asthma    prn inhaler   Depression    Eczema    Endometriosis    Migraine with aura    Nasal bone fx-closed 06/23/2012   has a cut on nose from the injury   POTS (postural orthostatic tachycardia syndrome)      Home Medications Prior to Admission medications   Medication Sig Start Date End Date Taking? Authorizing Provider  albuterol (VENTOLIN HFA) 108 (90 Base) MCG/ACT inhaler Inhale 2 puffs into the lungs every 6 (six) hours as needed. Patient taking differently: Inhale 2 puffs into the lungs every 6 (six) hours as needed for wheezing or shortness of breath. 02/26/21   Alfonse Spruce, MD  cariprazine (VRAYLAR) 1.5 MG capsule Take 1 capsule (1.5 mg total) by mouth daily. 10/13/23   Mozingo, Thereasa Solo, NP  desvenlafaxine (PRISTIQ) 50 MG 24 hr tablet Take 1 tablet (50 mg total) by mouth daily. 10/13/23   Mozingo, Thereasa Solo, NP  EPINEPHrine (EPIPEN 2-PAK) 0.3 mg/0.3 mL IJ SOAJ injection Inject 0.3 mLs (0.3 mg total) into the muscle once. As needed for severe life-threatening allergic reaction Patient taking differently: Inject 0.3 mg into the muscle once as needed for anaphylaxis (severe life-threatening allergic reaction).  08/15/15   Kozlow, Alvira Philips, MD  mometasone (ELOCON) 0.1 % cream Apply 1 Application topically daily. 03/11/23   Hetty Blend, FNP  naproxen (NAPROSYN) 500 MG tablet Take 1 tablet (500 mg total) by mouth 2 (two) times daily. 09/12/23   Palumbo, April, MD  norethindrone (MICRONOR) 0.35 MG tablet Take 1 tablet by mouth daily.    [provider]      Allergies    Other, Peanut-containing drug products, Penicillins, and Serotonin reuptake inhibitors (ssris)    Review of Systems   Review of Systems  Musculoskeletal:  Positive for extremity weakness.  Neurological:  Positive for weakness.    Physical Exam Updated Vital Signs BP 109/79 (BP Location: Left Arm)   Pulse 71   Temp 97.8 F (36.6 C)   Resp 18   Ht 5\' 5"  (1.651 m)   Wt 59 kg   LMP 12/13/2023 (Exact Date)   SpO2 99%   BMI 21.63 kg/m  Physical Exam Vitals and nursing note reviewed.  Constitutional:      General: She is not in acute distress.    Appearance: She is well-developed.  HENT:     Head: Normocephalic and atraumatic.  Eyes:     Conjunctiva/sclera: Conjunctivae normal.  Cardiovascular:     Rate and Rhythm: Normal rate and regular rhythm.     Heart sounds: No murmur heard.  Pulmonary:     Effort: Pulmonary effort is normal. No respiratory distress.     Breath sounds: Normal breath sounds.  Abdominal:     Palpations: Abdomen is soft.     Tenderness: There is no abdominal tenderness.  Musculoskeletal:        General: No swelling.     Cervical back: Neck supple.     Comments: No point of lower extremity tenderness  Skin:    General: Skin is warm and dry.     Capillary Refill: Capillary refill takes less than 2 seconds.  Neurological:     Mental Status: She is alert.     Comments: Patient is alert and oriented. There is no abnormal phonation. Symmetric smile without facial droop.  Moves all extremities spontaneously. 5/5 strength in upper and lower extremities. .  Does report diffuse lower extremity  sensation deficit. There is no nystagmus. EOMI, PERRL. Coordination intact with finger to nose and normal ambulation.  Patellar reflexes symmetric.    Psychiatric:        Mood and Affect: Mood normal.     ED Results / Procedures / Treatments   Labs (all labs ordered are listed, but only abnormal results are displayed) Labs Reviewed  RESP PANEL BY RT-PCR (RSV, FLU A&B, COVID)  [id number]  BASIC METABOLIC PANEL  CBC    EKG None  Radiology No results found.  Procedures Procedures    Medications Ordered in ED Medications - No data to display  ED Course/ Medical Decision Making/ A&P                                 Medical Decision Making Amount and/or Complexity of Data Reviewed Labs: ordered.   This patient presents to the ED with chief complaint(s) of difficulty walking.  The complaint involves an extensive differential diagnosis and also carries with it a high risk of complications and morbidity.   pertinent past medical history as listed in HPI  The differential diagnosis includes  Cauda equina, spinal abscess, MS/autoimmune, musculoskeletal, spondylosis, sciatica, septic joint, DVT  Additional history obtained: Additional history obtained from significant other Records reviewed Care Everywhere/External Records  Initial Assessment:   Hemodynamically stable, afebrile, nontoxic-appearing patient presenting with difficulty walking x 4 days.  She does endorse numbness and tingling involving her lower extremities that has been ongoing for the at least the last month.  Does endorse mild low back pain that is chronic without recent worsening.  On exam she has no midline or paraspinal tenderness.  She has 5 out of 5 lower extremity strength, reflexes are intact and symmetric.  She does endorse diffuse bilateral lower extremity sensation deficit.  When going from sitting to standing her body profusely shakes, this resolves upon sitting.  No urinary incontinence.  Overall low  suspicion for cauda equina/spinal abscess.  Back pain is minimal.  No recent viral illness to suggest Guillain-Barr.  Independent ECG interpretation:  none  Independent labs interpretation:  The following labs were independently interpreted:  CBC, BMP and respiratory panel all unremarkable  Independent visualization and interpretation of imaging: none  Treatment and Reassessment: Discussed with patient normal labs.  Discussed that I would consult neuro and she would likely require MRI imaging however we do not have this available this evening.  She would then need to be transferred.  Patient would prefer discharge home with close neurology follow-up.  Will provide referral.  She feels comfortable going  home as symptoms have been ongoing for the past 4 days and if anything have improved.  Consultations obtained:   none  Disposition:   Patient will be discharged home.  Provided neurology referral.  The patient has been appropriately medically screened and/or stabilized in the ED. I have low suspicion for any other emergent medical condition which would require further screening, evaluation or treatment in the ED or require inpatient management. At time of discharge the patient is hemodynamically stable and in no acute distress. I have discussed work-up results and diagnosis with patient and answered all questions. Patient is agreeable with discharge plan. We discussed strict return precautions for returning to the emergency department and they verbalized understanding.     Social Determinants of Health:   none  This note was dictated with voice recognition software.  Despite best efforts at proofreading, errors may have occurred which can change the documentation meaning.          Final Clinical Impression(s) / ED Diagnoses Final diagnoses:  Leg weakness, bilateral    Rx / DC Orders ED Discharge Orders          Ordered    Ambulatory referral to Neurology       Comments: An  appointment is requested in approximately: 1 week   12/15/23 1925              Halford Decamp, PA-C 12/15/23 1930    Sloan Leiter, DO 12/20/23 [phone]

## 2023-12-18 ENCOUNTER — Ambulatory Visit (INDEPENDENT_AMBULATORY_CARE_PROVIDER_SITE_OTHER): Admitting: Neurology

## 2023-12-18 ENCOUNTER — Encounter: Payer: Self-pay | Admitting: Neurology

## 2023-12-18 VITALS — BP 113/70 | HR 81 | Ht 65.5 in | Wt 135.6 lb

## 2023-12-18 DIAGNOSIS — R292 Abnormal reflex: Secondary | ICD-10-CM

## 2023-12-18 DIAGNOSIS — R200 Anesthesia of skin: Secondary | ICD-10-CM | POA: Diagnosis not present

## 2023-12-18 DIAGNOSIS — R278 Other lack of coordination: Secondary | ICD-10-CM

## 2023-12-18 DIAGNOSIS — R269 Unspecified abnormalities of gait and mobility: Secondary | ICD-10-CM

## 2023-12-18 DIAGNOSIS — R55 Syncope and collapse: Secondary | ICD-10-CM

## 2023-12-18 DIAGNOSIS — R29898 Other symptoms and signs involving the musculoskeletal system: Secondary | ICD-10-CM

## 2023-12-18 DIAGNOSIS — M6281 Muscle weakness (generalized): Secondary | ICD-10-CM

## 2023-12-18 DIAGNOSIS — R258 Other abnormal involuntary movements: Secondary | ICD-10-CM
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
## 2023-12-18 NOTE — Patient Instructions (Addendum)
 MRI brain and Lumbar spine Physical therapy    Electromyoneurogram Electromyoneurogram is a test to check how well your muscles and nerves are working. This procedure includes the combined use of electromyogram (EMG) and nerve conduction study (NCS). EMG is used to evaluate muscles and the nerves that control those muscles. NCS, which is also called electroneurogram, measures how well your nerves conduct electricity. The procedures should be done together to check if your muscles and nerves are healthy. If the results of the tests are abnormal, this may indicate disease or injury, such as a neuromuscular disease or peripheral nerve damage. Tell a health care provider about: Any allergies you have. All medicines you are taking, including vitamins, herbs, eye drops, creams, and over-the-counter medicines. Any bleeding problems you have. Any surgeries you have had. Any medical conditions you have. What are the risks? Generally, this is a safe procedure. However, problems may occur, including: Bleeding or bruising. Infection where the electrodes were inserted. What happens before the test? Medicines Take all of your usually prescribed medications before this testing is performed. Do not stop your blood thinners unless advised by your prescribing physician. General instructions Your health care provider may ask you to warm the limb that will be checked with warm water, hot pack, or wrapping the limb in a blanket. Do not use lotions or creams on the same day that you will be having the procedure. What happens during the test? For EMG  Your health care provider will ask you to stay in a position so that the muscle being studied can be accessed. You will be sitting or lying down. You may be given a medicine to numb the area (local anesthetic) and the skin will be disinfected. A very thin needle that has an electrode will be inserted into your muscle, one muscle at a time. Typically, multiple  muscles are evaluated during a single study. Another small electrode will be placed on your skin near the muscle. Your health care provider will ask you to continue to remain still. The electrodes will record the electrical activity of your muscles. You may see this on a monitor or hear it in the room. After your muscles have been studied at rest, your health care provider will ask you to contract or flex your muscles. The electrodes will record the electrical activity of your muscles. Your health care provider will remove the electrodes and the electrode needle when the procedure is finished. The procedure may vary among health care providers and hospitals. For NCS  An electrode that records your nerve activity (recording electrode) will be placed on your skin by the muscle that is being studied. An electrode that is used as a reference (reference electrode) will be placed near the recording electrode. A paste or gel will be applied to your skin between the recording electrode and the reference electrode. Your nerve will be stimulated with a mild shock. The speed of the nerves and strength of response is recorded by the electrodes. Your health care provider will remove the electrodes and the gel when the procedure is finished. The procedure may vary among health care providers and hospitals. What can I expect after the test? It is up to you to get your test results. Ask your health care provider, or the department that is doing the test, when your results will be ready. Your health care provider may: Give you medicines for any pain. Monitor the insertion sites to make sure that bleeding stops. You should be able  to drive yourself to and from the test. Discomfort can persist for a few hours after the test, but should be better the next day. Contact a health care provider if: You have swelling, redness, or drainage at any of the insertion sites. Summary Electromyoneurogram is a test to check  how well your muscles and nerves are working. If the results of the tests are abnormal, this may indicate disease or injury. This is a safe procedure. However, problems may occur, such as bleeding and infection. Your health care provider will do two tests to complete this procedure. One checks your muscles (EMG) and another checks your nerves (NCS). It is up to you to get your test results. Ask your health care provider, or the department that is doing the test, when your results will be ready. This information is not intended to replace advice given to you by your health care provider. Make sure you discuss any questions you have with your health care provider. Document Revised: 05/29/2021 Document Reviewed: 04/28/2021 Elsevier Patient Education  2024 [employer].

## 2023-12-19 LAB — CK: Total CK: 91 U/L (ref 32–182)

## 2023-12-19 LAB — B12 AND FOLATE PANEL
Folate: 8.7 ng/mL (ref >3.0)
Vitamin B-12: 478 pg/mL (ref 232–1245)

## 2023-12-19 LAB — TSH RFX ON ABNORMAL TO FREE T4: TSH: 0.904 u[IU]/mL (ref 0.450–4.500)

## 2023-12-20 ENCOUNTER — Encounter: Payer: Self-pay | Admitting: Neurology

## 2023-12-20 DIAGNOSIS — R269 Unspecified abnormalities of gait and mobility: Secondary | ICD-10-CM | POA: Insufficient documentation

## 2023-12-20 DIAGNOSIS — R200 Anesthesia of skin: Secondary | ICD-10-CM | POA: Insufficient documentation

## 2023-12-20 DIAGNOSIS — R29898 Other symptoms and signs involving the musculoskeletal system: Secondary | ICD-10-CM | POA: Insufficient documentation

## 2023-12-20 DIAGNOSIS — M6281 Muscle weakness (generalized): Secondary | ICD-10-CM | POA: Insufficient documentation

## 2023-12-20 DIAGNOSIS — R55 Syncope and collapse: Secondary | ICD-10-CM | POA: Insufficient documentation

## 2023-12-21 ENCOUNTER — Telehealth: Payer: Self-pay | Admitting: Neurology

## 2023-12-21 NOTE — Telephone Encounter (Signed)
 sent to [facility] they obtain [place] [phone]

## 2023-12-24 ENCOUNTER — Encounter: Payer: Self-pay | Admitting: Neurology

## 2023-12-24 ENCOUNTER — Ambulatory Visit (INDEPENDENT_AMBULATORY_CARE_PROVIDER_SITE_OTHER): Admitting: Neurology

## 2023-12-24 DIAGNOSIS — R200 Anesthesia of skin: Secondary | ICD-10-CM

## 2023-12-24 DIAGNOSIS — R55 Syncope and collapse: Secondary | ICD-10-CM | POA: Diagnosis not present

## 2023-12-24 DIAGNOSIS — R29898 Other symptoms and signs involving the musculoskeletal system: Secondary | ICD-10-CM

## 2023-12-24 NOTE — Procedures (Signed)
    History:  18 year old woman with syncope   EEG classification: Awake and drowsy  Duration: 26 minutes   Technical aspects: This EEG study was done with scalp electrodes positioned according to the 10-20 International system of electrode placement. Electrical activity was reviewed with band pass filter of 1-70Hz , sensitivity of 7 uV/mm, display speed of 9mm/sec with a 60Hz  notched filter applied as appropriate. EEG data were recorded continuously and digitally stored.   Description of the recording: The background rhythms of this recording consists of a fairly well modulated medium amplitude alpha rhythm of 9 Hz that is reactive to eye opening and closure. Present in the anterior head region is a 15-20 Hz beta activity. Photic stimulation was performed, did not show any abnormalities. Hyperventilation was also performed, did not show any abnormalities. Drowsiness was manifested by background fragmentation. No abnormal epileptiform discharges seen during this recording. There was no focal slowing. There were no electrographic seizure identified.   Abnormality: None   Impression: This is a normal awake and drowsy EEG. No evidence of interictal epileptiform discharges. Normal EEGs, however, do not rule out epilepsy.    Windell Norfolk, MD [facility]

## 2023-12-30 ENCOUNTER — Telehealth: Payer: Self-pay | Admitting: Neurology

## 2023-12-30 NOTE — Telephone Encounter (Signed)
 Patient called in requesting sooner follow up appointment. States she was told to call back and request a sooner appt if she has a video of the symptoms she is having. Patient states she has this now and would like a sooner appt? I do not have anything available I can move pt up. I do see this mentioned in Dr. Trevor Mace note-unsure if this need to be with dr. Lucia Gaskins or Maralyn Sago. She has a f/u with Maralyn Sago in September.

## 2023-12-30 NOTE — Telephone Encounter (Signed)
 You can attach media to mychart. Can you ask them to try and send it in mychart so I can take a look at it? thanks

## 2024-01-01 NOTE — Telephone Encounter (Signed)
 This does not look like seizures to me, the routine eeg was normal, I thnk we can wait for the MRI lumbar spine and brain thanks

## 2024-01-08 ENCOUNTER — Encounter: Payer: Self-pay | Admitting: Neurology

## 2024-01-11 ENCOUNTER — Ambulatory Visit
Admission: RE | Admit: 2024-01-11 | Discharge: 2024-01-11 | Disposition: A | Discharge status: Home / Self Care | Source: Ambulatory Visit | Attending: Neurology | Admitting: Neurology

## 2024-01-11 ENCOUNTER — Ambulatory Visit
Admission: RE | Admit: 2024-01-11 | Discharge: 2024-01-11 | Disposition: A | Discharge status: Home / Self Care | Source: Ambulatory Visit | Attending: Neurology

## 2024-01-11 DIAGNOSIS — R278 Other lack of coordination: Secondary | ICD-10-CM

## 2024-01-11 DIAGNOSIS — R258 Other abnormal involuntary movements: Secondary | ICD-10-CM

## 2024-01-11 DIAGNOSIS — M6281 Muscle weakness (generalized): Secondary | ICD-10-CM

## 2024-01-11 DIAGNOSIS — R269 Unspecified abnormalities of gait and mobility: Secondary | ICD-10-CM | POA: Diagnosis not present

## 2024-01-11 DIAGNOSIS — R292 Abnormal reflex: Secondary | ICD-10-CM

## 2024-01-11 DIAGNOSIS — R55 Syncope and collapse: Secondary | ICD-10-CM

## 2024-01-11 DIAGNOSIS — R200 Anesthesia of skin: Secondary | ICD-10-CM

## 2024-01-11 DIAGNOSIS — R29898 Other symptoms and signs involving the musculoskeletal system: Secondary | ICD-10-CM | POA: Diagnosis not present

## 2024-01-11 MED ORDER — GADOPICLENOL 0.5 MMOL/ML IV SOLN
7.5000 mL | Freq: Once | INTRAVENOUS | Status: AC | PRN
  Administered 2024-01-11: 3 mL via INTRAVENOUS

## 2024-01-12 ENCOUNTER — Encounter: Payer: Self-pay | Admitting: Neurology

## 2024-02-13 ENCOUNTER — Other Ambulatory Visit: Payer: Self-pay | Admitting: Adult Health

## 2024-02-13 DIAGNOSIS — F319 Bipolar disorder, unspecified: Secondary | ICD-10-CM

## 2024-03-27 ENCOUNTER — Other Ambulatory Visit: Payer: Self-pay | Admitting: Adult Health

## 2024-03-27 DIAGNOSIS — F319 Bipolar disorder, unspecified: Secondary | ICD-10-CM

## 2024-04-27 ENCOUNTER — Encounter: Payer: Self-pay | Admitting: Adult Health

## 2024-04-27 ENCOUNTER — Telehealth: Payer: Self-pay | Admitting: Adult Health

## 2024-04-27 DIAGNOSIS — F319 Bipolar disorder, unspecified: Secondary | ICD-10-CM

## 2024-04-27 DIAGNOSIS — F819 Developmental disorder of scholastic skills, unspecified: Secondary | ICD-10-CM

## 2024-04-27 DIAGNOSIS — F411 Generalized anxiety disorder: Secondary | ICD-10-CM

## 2024-04-27 DIAGNOSIS — G4700 Insomnia, unspecified: Secondary | ICD-10-CM

## 2024-04-27 MED ORDER — CARIPRAZINE HCL 3 MG PO CAPS
3.0000 mg | ORAL_CAPSULE | Freq: Every day | ORAL | 2 refills | Status: AC
Start: 2024-04-27 — End: ?

## 2024-04-27 NOTE — Progress Notes (Addendum)
 Ashlee Perez [id number] 06-09-2006 18 y.o.  Virtual Visit via Video Note  I connected with pt @ on 04/27/24 at  3:30 PM EDT by a video enabled telemedicine application and verified that I am speaking with the correct person using two identifiers.   I discussed the limitations of evaluation and management by telemedicine and the availability of in person appointments. The patient expressed understanding and agreed to proceed.  I discussed the assessment and treatment plan with the patient. The patient was provided an opportunity to ask questions and all were answered. The patient agreed with the plan and demonstrated an understanding of the instructions.   The patient was advised to call back or seek an in-person evaluation if the symptoms worsen or if the condition fails to improve as anticipated.  I provided 25 minutes of non-face-to-face time during this encounter.  The patient was located at home.  The provider was located at [facility].   Angeline LOISE Sayers, NP   Subjective:   Patient ID:  Ashlee Perez is a 18 y.o. (DOB June 11, 2006) female.  Chief Complaint: No chief complaint on file.   HPI Ashlee Perez presents for follow-up of BPD, anxiety, learning disorder, and insomnia.  Describes mood today as not the best. Pleasant. Reports tearfulness. Mood symptoms - reports increased depression - pretty frequently. Reports her physical health has effected her mental health - seizures. 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 Reports lower interest and motivation. Reports some irritability. Denies anxiety. Denies recent panic attacks. Reports some over thinking. Denies worry and rumination. Mood is variable - up and down a lot.  Stating I feel like I'm just getting by. Feels like the Vraylar  is  helpful and would like to try an increased dose. Taking medications as prescribed.  Energy levels lower - feels fatigued all the time. Active, does not have a regular exercise routine.  Enjoys some usual interests and activities. Lives with boyfriend. Parents and sister are in [place] . Spending time with family.  Appetite adequate. Weight gain - 130 to 140 pounds - 65.  Reports sleeping well. Averages 8 to 14 hours. Denies daytime napping. Focus and concentration ok. Completing tasks. Managing aspects of household. Received high school diploma. Unemployed. Denies SI or HI.  Denies AH or VH. Denies self harm.  Reports using THC weekly.  Denies alcohol use.  Previous medications: Risperdal , Latuda , Vraylar , Abilify   Review of Systems:  Review of Systems  Musculoskeletal:  Negative for gait problem.  Neurological:  Negative for tremors.  Psychiatric/Behavioral:         Please refer to HPI    Medications: I have reviewed the patient's current medications.  Current Outpatient Medications  Medication Sig Dispense Refill   albuterol  (VENTOLIN  HFA) 108 (90 Base) MCG/ACT inhaler Inhale 2 puffs into the lungs every 6 (six) hours as needed. (Patient taking differently: Inhale 2 puffs into the lungs every 6 (six) hours as needed for wheezing or shortness of breath.) 1 each 1   EPINEPHrine  (EPIPEN  2-PAK) 0.3 mg/0.3 mL IJ SOAJ injection Inject 0.3 mLs (0.3 mg total) into the muscle once. As needed for severe life-threatening allergic reaction (Patient taking differently: Inject 0.3 mg into the muscle once as needed for anaphylaxis (severe life-threatening allergic reaction).) 2 Device 2   midodrine (PROAMATINE) 5 MG tablet Take 5 mg by mouth as needed (POTS).  propranolol  (INDERAL ) 10 MG tablet Take 10 mg by mouth as needed.     propranolol  ER (INDERAL  LA) 120 MG 24 hr capsule Take 120 mg by mouth daily. (Patient not taking: Reported on 12/18/2023)     VRAYLAR  1.5 MG capsule TAKE 1 CAPSULE BY  MOUTH EVERY DAY 30 capsule 0   No current facility-administered medications for this visit.    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 nuts   Peanut-Containing Drug Products Anaphylaxis   Penicillins Hives   Serotonin Reuptake Inhibitors (Ssris) Other (See Comments)    Pt says that no one in her family can take them, they mess with mentally    Past Medical History:  Diagnosis Date   Allergy    Anxiety    Asthma    prn inhaler   Depression    Eczema    Endometriosis    Migraine with aura    Nasal bone fx-closed 06/23/2012   has a cut on nose from the injury   POTS (postural orthostatic tachycardia syndrome)     Family History  Problem Relation Age of Onset   Allergic rhinitis Mother    Allergic rhinitis Father    Heart disease Maternal Grandfather        MI   Heart disease Paternal Grandmother    Heart disease Paternal Grandfather     Social History   Socioeconomic History   Marital status: Single    Spouse name: Not on file   Number of children: Not on file   Years of education: Not on file   Highest education level: Not on file  Occupational History   Not on file  Tobacco Use   Smoking status: Never   Smokeless tobacco: Never  Vaping Use   Vaping status: Never Used  Substance and Sexual Activity   Alcohol use: Yes    Comment: occasaional   Drug use: No   Sexual activity: Yes    Partners: Male    Birth control/protection: Pill  Other Topics Concern   Not on file  Social History Narrative   Lives with boyfriend  1 dogs, 3 cat.   Working none   Caffiene 2-3 cans soda daily   Social Drivers of Corporate investment banker Strain: Patient Declined (01/31/2024)   Received from [employer]   Overall Financial Resource Strain (CARDIA)    Difficulty of Paying Living Expenses: Patient declined  Food Insecurity: Patient Declined (01/31/2024)   Received from [facility]    Hunger Vital Sign    Within the past 12 months, you worried that your food would run out before you got the money to buy more.: Patient declined    Within the past 12 months, the food you bought just didn't last and you didn't have money to get more.: Patient declined  Transportation Needs: Unmet Transportation Needs (01/31/2024)   Received from [facility] - Transportation    Lack of Transportation (Medical): Yes    Lack of Transportation (Non-Medical): No  Physical Activity: Unknown (01/31/2024)   Received from [facility]   Exercise Vital Sign    On average, how many days per week do you engage in moderate to strenuous exercise (like a brisk walk)?: 1 day    Minutes of Exercise per Session: Not on file  Stress: Stress Concern Present (01/31/2024)   Received from [facility] - Occupational Stress Questionnaire    Feeling of Stress : Very much  Social Connections: Socially Integrated (01/31/2024)   Received from [facility]   Social Network    How would you rate your social network (family, work, friends)?: Good participation with social networks  Intimate Partner Violence: Not At Risk (01/31/2024)   Received from [facility]   HITS    Over the last 12 months how often did your partner physically hurt you?: Never    Over the last 12 months how often did your partner insult you or talk down to you?: Never    Over the last 12 months how often did your partner threaten you with physical harm?: Never    Over the last 12 months how often did your partner scream or curse at you?: Never    Past Medical History, Surgical history, Social history, and Family history were reviewed and updated as appropriate.   Please see review of systems for further details on the patient's review from today.   Objective:   Physical Exam:  There were no vitals taken for this visit.  Physical Exam Constitutional:      General: She is not in acute  distress. Musculoskeletal:        General: No deformity.  Neurological:     Mental Status: She is alert and oriented to person, place, and time.     Coordination: Coordination normal.  Psychiatric:        Attention and Perception: Attention and perception normal. She does not perceive auditory or visual hallucinations.        Mood and Affect: Mood normal. Mood is not anxious or depressed. Affect is not labile, blunt, angry or inappropriate.        Speech: Speech normal.        Behavior: Behavior normal.        Thought Content: Thought content normal. Thought content is not paranoid or delusional. Thought content does not include homicidal or suicidal ideation. Thought content does not include homicidal or suicidal plan.        Cognition and Memory: Cognition and memory normal.        Judgment: Judgment normal.     Comments: Insight intact     Lab Review:     Component Value Date/Time   NA 137 12/15/2023 1610   NA 145 (H) 08/16/2015 0000   K 3.8 12/15/2023 1610   CL 103 12/15/2023 1610   CO2 26 12/15/2023 1610   GLUCOSE 91 12/15/2023 1610   BUN 12 12/15/2023 1610   BUN 9 08/16/2015 0000   CREATININE 0.65 12/15/2023 1610   CALCIUM 9.1 12/15/2023 1610   PROT 7.2 02/12/2022 2348   PROT 7.1 08/16/2015 0000   ALBUMIN 4.0 02/12/2022 2348   ALBUMIN 4.4 08/16/2015 0000   AST 21 02/12/2022 2348   ALT 12 02/12/2022 2348   ALKPHOS 67 02/12/2022 2348   BILITOT 0.9 02/12/2022 2348   BILITOT 0.3 08/16/2015 0000   GFRNONAA >60 12/15/2023 1610   GFRAA NOT CALCULATED 04/24/2020 0526       Component Value Date/Time   WBC 6.3 12/15/2023 1610   RBC 4.54 12/15/2023 1610   HGB 12.9 12/15/2023 1610   HGB 13.4 08/16/2015 0000   HCT 37.4 12/15/2023 1610   HCT 38.9 08/16/2015 0000   PLT 303 12/15/2023 1610   PLT 354 08/16/2015 0000   MCV 82.4 12/15/2023 1610   MCV 77 08/16/2015 0000   MCH 28.4 12/15/2023 1610  MCHC 34.5 12/15/2023 1610   RDW 12.1 12/15/2023 1610   RDW 14.1 08/16/2015  0000   LYMPHSABS 3.6 02/12/2022 2348   LYMPHSABS 3.5 08/16/2015 0000   MONOABS 1.2 02/12/2022 2348   EOSABS 0.6 02/12/2022 2348   EOSABS 1.0 (H) 08/16/2015 0000   BASOSABS 0.0 02/12/2022 2348   BASOSABS 0.0 08/16/2015 0000    No results found for: POCLITH, LITHIUM   No results found for: PHENYTOIN, PHENOBARB, VALPROATE, CBMZ   .res Assessment: Plan:    Plan:  Increase Vraylar  1.5mg  to 3mg  daily for increased mood instability.   Will call for emotional support animal letter if needed.  Patient advised to contact office with any questions, adverse effects, or acute worsening in signs and symptoms.  RTC 4 weeks  25 minutes spent dedicated to the care of this patient on the date of this encounter to include pre-visit review of records, ordering of medication, post visit documentation, and face-to-face time with the patient discussing BPD, anxiety, learning disorder, and insomnia. Discussed continuing current medication regimen.  Discussed potential metabolic side effects associated with atypical antipsychotics, as well as potential risk for movement side effects. Advised pt to contact office if movement side effects occur.      There are no diagnoses linked to this encounter.   Please see After Visit Summary for patient specific instructions.  Future Appointments  Date Time Provider Department Center  04/27/2024  3:30 PM Pinky Ravan Nattalie, NP [facility] None  06/15/2024  3:00 PM Gayland Lauraine PARAS, NP [facility] None    No orders of the defined types were placed in this encounter.     -------------------------------

## 2024-05-02 ENCOUNTER — Other Ambulatory Visit: Payer: Self-pay

## 2024-05-02 ENCOUNTER — Emergency Department (HOSPITAL_COMMUNITY)

## 2024-05-02 ENCOUNTER — Encounter (HOSPITAL_COMMUNITY): Payer: Self-pay

## 2024-05-02 ENCOUNTER — Emergency Department (HOSPITAL_COMMUNITY)
Admission: EM | Admit: 2024-05-02 | Discharge: 2024-05-02 | Disposition: A | Discharge status: Home / Self Care | Attending: Emergency Medicine | Admitting: Emergency Medicine

## 2024-05-02 DIAGNOSIS — Z91010 Allergy to peanuts: Secondary | ICD-10-CM | POA: Insufficient documentation

## 2024-05-02 DIAGNOSIS — S59901A Unspecified injury of right elbow, initial encounter: Secondary | ICD-10-CM | POA: Diagnosis present

## 2024-05-02 DIAGNOSIS — X58XXXA Exposure to other specified factors, initial encounter: Secondary | ICD-10-CM | POA: Insufficient documentation

## 2024-05-02 DIAGNOSIS — S5001XA Contusion of right elbow, initial encounter: Secondary | ICD-10-CM | POA: Insufficient documentation

## 2024-05-02 DIAGNOSIS — J45909 Unspecified asthma, uncomplicated: Secondary | ICD-10-CM | POA: Insufficient documentation

## 2024-05-02 LAB — BASIC METABOLIC PANEL WITH GFR
Anion gap: 9 (ref 5–15)
BUN: 9 mg/dL (ref 6–20)
CO2: 25 mmol/L (ref 22–32)
Calcium: 9.3 mg/dL (ref 8.9–10.3)
Chloride: 103 mmol/L (ref 98–111)
Creatinine, Ser: 0.78 mg/dL (ref 0.44–1.00)
GFR, Estimated: >60 mL/min (ref >60)
Glucose, Bld: 94 mg/dL (ref 70–99)
Potassium: 3.7 mmol/L (ref 3.5–5.1)
Sodium: 137 mmol/L (ref 135–145)

## 2024-05-02 LAB — CBC WITH DIFFERENTIAL/PLATELET
Abs Immature Granulocytes: 0.01 K/uL (ref 0.00–0.07)
Basophils Absolute: 0.1 K/uL (ref 0.0–0.1)
Basophils Relative: 1 %
Eosinophils Absolute: 0.3 K/uL (ref 0.0–0.5)
Eosinophils Relative: 4 %
HCT: 42.8 % (ref 36.0–46.0)
Hemoglobin: 14.1 g/dL (ref 12.0–15.0)
Immature Granulocytes: 0 %
Lymphocytes Relative: 32 %
Lymphs Abs: 2 K/uL (ref 0.7–4.0)
MCH: 27.6 pg (ref 26.0–34.0)
MCHC: 32.9 g/dL (ref 30.0–36.0)
MCV: 83.9 fL (ref 80.0–100.0)
Monocytes Absolute: 0.5 K/uL (ref 0.1–1.0)
Monocytes Relative: 8 %
Neutro Abs: 3.5 K/uL (ref 1.7–7.7)
Neutrophils Relative %: 55 %
Platelets: 343 K/uL (ref 150–400)
RBC: 5.1 MIL/uL (ref 3.87–5.11)
RDW: 11.9 % (ref 11.5–15.5)
WBC: 6.3 K/uL (ref 4.0–10.5)
nRBC: 0 % (ref 0.0–0.2)

## 2024-05-02 LAB — HCG, SERUM, QUALITATIVE: Preg, Serum: NEGATIVE

## 2024-05-02 MED ORDER — DIAZEPAM 5 MG/ML IJ SOLN
5.0000 mg | Freq: Once | INTRAMUSCULAR | Status: AC
  Administered 2024-05-02: 5 mg via INTRAVENOUS
  Filled 2024-05-02: qty 2

## 2024-05-02 MED ORDER — SODIUM CHLORIDE 0.9 % IV BOLUS
1000.0000 mL | Freq: Once | INTRAVENOUS | Status: AC
  Administered 2024-05-02: 1000 mL via INTRAVENOUS

## 2024-05-02 MED ORDER — KETOROLAC TROMETHAMINE 15 MG/ML IJ SOLN
15.0000 mg | Freq: Once | INTRAMUSCULAR | Status: AC
  Administered 2024-05-02: 15 mg via INTRAVENOUS
  Filled 2024-05-02: qty 1

## 2024-05-02 NOTE — ED Provider Notes (Signed)
 [place] EMERGENCY DEPARTMENT AT [facility] Provider Note  CSN: [id number] Arrival date & time: 05/02/24 9079  Chief Complaint(s) No chief complaint on file.  HPI Ashlee Perez is a 18 y.o. female history of POTS, functional neurologic disorder, bipolar disorder presenting with possible syncopal episode.  Patient reports that she frequently has syncopal episodes.  She reports that she stood up from toilet and then fell lost consciousness but somehow woke up in the chair.  Reports that she did not hit her head.  She reports this is consistent with prior episodes of syncope.  She reports that she thinks she fell on her right arm and has pain in the right elbow, wrist and shoulder.  She reports tingling sensation of numbness in the right arm distal to the elbow.  No headache.  No neck pain.  No chest or back pain.  No abdominal pain.  No pain in left arm or the legs.  Past Medical History Past Medical History:  Diagnosis Date   Allergy    Anxiety    Asthma    prn inhaler   Depression    Eczema    Endometriosis    Migraine with aura    Nasal bone fx-closed 06/23/2012   has a cut on nose from the injury   POTS (postural orthostatic tachycardia syndrome)    Patient Active Problem List   Diagnosis Date Noted   Leg weakness, bilateral 12/20/2023   Numbness of right foot 12/20/2023   Syncope 12/20/2023   Gait abnormality 12/20/2023   Muscle weakness of proximal extremity 12/20/2023   Mild intermittent asthma without complication 03/11/2023   Chronic rhinitis 03/11/2023   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   MDD (major depressive disorder), recurrent severe, 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   Family history of brain aneurysm 06/27/2021   New daily persistent headache 06/26/2021   Migraine without aura and without status migrainosus, not intractable 06/26/2021   Bipolar I disorder, current or most recent episode depressed, with psychotic features (HCC)  04/26/2020   Overdose of antidepressant, intentional self-harm, initial encounter (HCC) 04/24/2020   Pilonidal abscess 08/14/2019   Mild persistent asthma 08/15/2015   Allergic rhinoconjunctivitis 08/15/2015   Atopic dermatitis 08/15/2015   Allergic urticaria 08/15/2015   Allergic reaction 08/15/2015   Home Medication(s) Prior to Admission medications   Medication Sig Start Date End Date Taking? Authorizing Provider  albuterol  (VENTOLIN  HFA) 108 (90 Base) MCG/ACT inhaler Inhale 2 puffs into the lungs every 6 (six) hours as needed. Patient taking differently: Inhale 2 puffs into the lungs every 6 (six) hours as needed for wheezing or shortness of breath. 02/26/21   Iva Marty Saltness, MD  cariprazine  (VRAYLAR ) 3 MG capsule Take 1 capsule (3 mg total) by mouth daily. 04/27/24   Mozingo, Regina Nattalie, NP  EPINEPHrine  (EPIPEN  2-PAK) 0.3 mg/0.3 mL IJ SOAJ injection Inject 0.3 mLs (0.3 mg total) into the muscle once. As needed for severe life-threatening allergic reaction Patient taking differently: Inject 0.3 mg into the muscle once as needed for anaphylaxis (severe life-threatening allergic reaction). 08/15/15   Kozlow, Camellia PARAS, MD  midodrine (PROAMATINE) 5 MG tablet Take 5 mg by mouth as needed (POTS).    [provider]  propranolol  (INDERAL ) 10 MG tablet Take 10 mg by mouth as needed. 10/14/23 10/13/24  [provider]  propranolol  ER (INDERAL  LA) 120 MG 24 hr capsule Take 120 mg by mouth daily. Patient not taking: Reported on 12/18/2023    [provider]  Past Surgical History Past Surgical History:  Procedure Laterality Date   CLOSED REDUCTION NASAL FRACTURE  06/30/2012   Procedure: CLOSED REDUCTION NASAL FRACTURE;  Surgeon: Ida Loader, MD;  Location: [facility];  Service: ENT;  Laterality: N/A;   EXCISION,  ENDOMETRIOSIS, LAPAROSCOPIC     07/10/2023   Family History Family History  Problem Relation Age of Onset   Allergic rhinitis Mother    Allergic rhinitis Father    Heart disease Maternal Grandfather        MI   Heart disease Paternal Grandmother    Heart disease Paternal Grandfather     Social History Social History   Tobacco Use   Smoking status: Never   Smokeless tobacco: Never  Vaping Use   Vaping status: Never Used  Substance Use Topics   Alcohol use: Yes    Comment: occasaional   Drug use: No   Allergies Other, Peanut-containing drug products, Penicillins, and Serotonin reuptake inhibitors (ssris)  Review of Systems Review of Systems  All other systems reviewed and are negative.   Physical Exam Vital Signs  I have reviewed the triage vital signs BP 125/89 (BP Location: Left Arm)   Pulse 86   Temp 98.4 F (36.9 C)   Resp 18   LMP 04/20/2024 (Approximate)   SpO2 99%  Physical Exam Vitals and nursing note reviewed.  Constitutional:      General: She is not in acute distress.    Appearance: She is well-developed.  HENT:     Head: Normocephalic and atraumatic.     Mouth/Throat:     Mouth: Mucous membranes are moist.  Eyes:     Pupils: Pupils are equal, round, and reactive to light.  Neck:     Comments: No midline C, T, L-spine tenderness Cardiovascular:     Rate and Rhythm: Normal rate and regular rhythm.     Pulses:          Radial pulses are 2+ on the right side and 2+ on the left side.     Heart sounds: No murmur heard. Pulmonary:     Effort: Pulmonary effort is normal. No respiratory distress.     Breath sounds: Normal breath sounds.  Abdominal:     General: Abdomen is flat.     Palpations: Abdomen is soft.     Tenderness: There is no abdominal tenderness.  Musculoskeletal:        General: No tenderness.     Right lower leg: No edema.     Left lower leg: No edema.     Comments: No objective deformity or focal tenderness to the right upper  extremity but mild diffuse tenderness around the right elbow, wrist and shoulder.  Patient able to range shoulder and wrist but not willing to range right elbow  Skin:    General: Skin is warm and dry.     Comments: No wounds or bruises  Neurological:     General: No focal deficit present.     Mental Status: She is alert. Mental status is at baseline.     Comments: Distal neurologic function intact in the radial, ulnar, and median nerve distributions.  Subjective decreased sensation but sensation is intact throughout all dermatomes  Psychiatric:        Mood and Affect: Mood normal.        Behavior: Behavior normal.     ED Results and Treatments Labs (all labs ordered are listed, but only abnormal results are displayed) Labs Reviewed  CBC WITH DIFFERENTIAL/PLATELET  BASIC METABOLIC PANEL WITH GFR  HCG, SERUM, QUALITATIVE                                                                                                                          Radiology DG Wrist Complete Right Result Date: 05/02/2024 CLINICAL DATA:  Right wrist pain following a syncopal episode today. EXAM: DG WRIST COMPLETE 3+V*R* COMPARISON:  10/23/2023 FINDINGS: There is no evidence of fracture or dislocation. There is no evidence of arthropathy or other focal bone abnormality. Soft tissues are unremarkable. IMPRESSION: Negative. Electronically Signed   By: Elspeth Bathe M.D.   On: 05/02/2024 10:40   DG Elbow Complete Right Result Date: 05/02/2024 CLINICAL DATA:  Right elbow pain following a syncopal episode today. EXAM: RIGHT ELBOW - COMPLETE 3+ VIEW COMPARISON:  None Available. FINDINGS: There is no evidence of fracture, dislocation, or joint effusion. There is no evidence of arthropathy or other focal bone abnormality. Soft tissues are unremarkable. IMPRESSION: Negative. Electronically Signed   By: Elspeth Bathe M.D.   On: 05/02/2024 10:39   DG Shoulder Right Result Date: 05/02/2024 CLINICAL DATA:  Right shoulder pain  following a syncopal episode today. EXAM: RIGHT SHOULDER - 2+ VIEW COMPARISON:  10/23/2023 FINDINGS: There is no evidence of fracture or dislocation. There is no evidence of arthropathy or other focal bone abnormality. Soft tissues are unremarkable. IMPRESSION: Negative. Electronically Signed   By: Elspeth Bathe M.D.   On: 05/02/2024 10:38    Pertinent labs & imaging results that were available during my care of the patient were reviewed by me and considered in my medical decision making (see MDM for details).  Medications Ordered in ED Medications  sodium chloride  0.9 % bolus 1,000 mL (1,000 mLs Intravenous New Bag/Given 05/02/24 1041)  ketorolac  (TORADOL ) 15 MG/ML injection 15 mg (15 mg Intravenous Given 05/02/24 1042)  diazepam  (VALIUM ) injection 5 mg (5 mg Intravenous Given 05/02/24 1045)                                                                                                                                     Procedures Procedures  (including critical care time)  Medical Decision Making / ED Course   MDM:  18 year old presenting to the emergency department with right elbow pain.  On exam, patient has no focal tenderness or deformity but has tenderness around the elbow.  Will  obtain x-ray of the elbow, shoulder and wrist.  Will check basic labs given syncopal episode as well as EKG, pregnancy test.  Seems consistent with prior episodes.  Patient reports subjective sensation of numbness and tingling in the right arm distal to the elbow but not consistent with any specific dermatome or peripheral nerve distribution so low concern for actual acute neurologic process, could be related to her chronic functional neurologic disorder for which she sees [facility].  Clinical Course as of 05/02/24 1218  Mon May 02, 2024  1217 After medication, patient feels much better, range of motion is normal.  X-rays were negative for acute fracture or injury.  Labs are also reassuring.  Patient reports  numbness sensation is improved.Will discharge patient to home. All questions answered. Patient comfortable with plan of discharge. Return precautions discussed with patient and specified on the after visit summary.  [WS]    Clinical Course User Index [WS] Francesca Elsie CROME, MD     Additional history obtained: -Additional history obtained from ems -External records from outside source obtained and reviewed including: Chart review including previous notes, labs, imaging, consultation notes including prior notes    Lab Tests: -I ordered, reviewed, and interpreted labs.   The pertinent results include:   Labs Reviewed  CBC WITH DIFFERENTIAL/PLATELET  BASIC METABOLIC PANEL WITH GFR  HCG, SERUM, QUALITATIVE    Notable for normal labs   EKG   EKG Interpretation Date/Time:  Monday May 02 2024 10:49:51 EDT Ventricular Rate:  80 PR Interval:  130 QRS Duration:  93 QT Interval:  366 QTC Calculation: 423 R Axis:   94  Text Interpretation: Sinus rhythm Borderline right axis deviation Confirmed by Francesca Elsie ([id number]) on 05/02/2024 10:57:06 AM         Imaging Studies ordered: I ordered imaging studies including XR elbow,. Wrist, shoulder  On my interpretation imaging demonstrates no acute injury  I independently visualized and interpreted imaging. I agree with the radiologist interpretation   Medicines ordered and prescription drug management: Meds ordered this encounter  Medications   sodium chloride  0.9 % bolus 1,000 mL   ketorolac  (TORADOL ) 15 MG/ML injection 15 mg   diazepam  (VALIUM ) injection 5 mg    -I have reviewed the patients home medicines and have made adjustments as needed  Cardiac Monitoring: The patient was maintained on a cardiac monitor.  I personally viewed and interpreted the cardiac monitored which showed an underlying rhythm of: NSR  Reevaluation: After the interventions noted above, I reevaluated the patient and found that their symptoms have  improved  Co morbidities that complicate the patient evaluation  Past Medical History:  Diagnosis Date   Allergy    Anxiety    Asthma    prn inhaler   Depression    Eczema    Endometriosis    Migraine with aura    Nasal bone fx-closed 06/23/2012   has a cut on nose from the injury   POTS (postural orthostatic tachycardia syndrome)       Dispostion: Disposition decision including need for hospitalization was considered, and patient discharged from emergency department.    Final Clinical Impression(s) / ED Diagnoses Final diagnoses:  Contusion of right elbow, initial encounter     This chart was dictated using voice recognition software.  Despite best efforts to proofread,  errors can occur which can change the documentation meaning.    Francesca Elsie CROME, MD 05/02/24 1218

## 2024-05-02 NOTE — Discharge Instructions (Addendum)
 We evaluated you for your elbow pain after your fainting episode.  Your x-rays were negative for fracture and your laboratory testing was also reassuring.  We think it is safe to go home.  Please follow-up with your primary doctor.    Please take Tylenol  (acetaminophen ) and Motrin  (ibuprofen ) for your symptoms at home.  You can take 1000 mg of Tylenol  every 6 hours and 600 mg of Motrin  every 6 hours as needed for your symptoms.  You can take these medicines together as needed, either at the same time, or alternating every 3 hours.  Please return if you have any new or worsening symptoms.

## 2024-05-02 NOTE — ED Triage Notes (Signed)
 Pt endorses hx of POTS; syncopal episode today approx 3 hours ago, c/o r arm pain, elbow, and shoulder pain with palpation; limited movement, radial pulse present

## 2024-06-15 ENCOUNTER — Ambulatory Visit: Admitting: Neurology

## 2024-06-15 ENCOUNTER — Encounter: Payer: Self-pay | Admitting: Neurology
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
## 2024-07-06 ENCOUNTER — Ambulatory Visit
Admission: EM | Admit: 2024-07-06 | Discharge: 2024-07-06 | Disposition: A | Discharge status: Home / Self Care | Attending: Physician Assistant | Admitting: Physician Assistant

## 2024-07-06 ENCOUNTER — Other Ambulatory Visit: Payer: Self-pay

## 2024-07-06 DIAGNOSIS — R0989 Other specified symptoms and signs involving the circulatory and respiratory systems: Secondary | ICD-10-CM

## 2024-07-06 DIAGNOSIS — R110 Nausea: Secondary | ICD-10-CM

## 2024-07-06 LAB — POC COVID19/FLU A&B COMBO
Covid Antigen, POC: NEGATIVE
Influenza A Antigen, POC: NEGATIVE
Influenza B Antigen, POC: NEGATIVE

## 2024-07-06 LAB — POCT RAPID STREP A (OFFICE): Rapid Strep A Screen: NEGATIVE

## 2024-07-06 MED ORDER — ONDANSETRON 4 MG PO TBDP: 4.0000 mg | ORAL_TABLET | Freq: Three times a day (TID) | ORAL | 0 refills | Status: AC | PRN

## 2024-07-06 NOTE — Discharge Instructions (Addendum)
 VISIT SUMMARY:  You came in today with fever, chills, and respiratory symptoms, including a cough, shortness of breath, and wheezing. You also reported body aches, joint pains, nausea, and mild abdominal pain. Your COVID, flu, and strep tests were negative, indicating a viral infection. You had an asthma attack earlier today, which was relieved by using your inhaler.  YOUR PLAN:  -ACUTE VIRAL UPPER RESPIRATORY INFECTION: You have a viral infection causing fever, chills, sore throat, post-nasal drip, cough, and body aches. Since your COVID, flu, and strep tests were negative, it is likely a common viral illness. Please stay hydrated and continue using over-the-counter medications to relieve your symptoms. If your fever or chills persist despite medication or if you experience significant respiratory distress, seek emergency care.  -ASTHMA: Your asthma has recently worsened, causing shortness of breath and wheezing. This was improved with your inhaler. Continue using your rescue inhalers and breathing treatments as needed, especially during shortness of breath or coughing spells. If you experience significant respiratory distress, seek emergency care.  -NAUSEA: You are experiencing nausea without vomiting. To help manage this, I have prescribed Zofran . If your nausea or vomiting prevents you from staying hydrated or eating properly, seek emergency care to avoid dehydration.  INSTRUCTIONS:  Please follow up if your symptoms do not improve or if you experience any worsening of your condition. Seek emergency care if you have persistent fever or chills, significant respiratory distress, or if nausea or vomiting prevents adequate hydration or nutrition.

## 2024-07-06 NOTE — ED Triage Notes (Signed)
 Pt presents with a chief complaint of fevers and generalized body aches. This is the third day of symptoms. Is currently nauseous. Pain is a 5/10. Increases with movement of the body. Denies fevers today. OTC Dayquil + Zyrtec taken with little improvement/relief. Denies sick contacts.

## 2024-07-06 NOTE — ED Provider Notes (Signed)
 [facility]    CSN: [id number] Arrival date & time: 07/06/24  9165      History   Chief Complaint Chief Complaint  Patient presents with   Fever   Generalized Body Aches    HPI Ashlee Perez is a 18 y.o. female.  has a past medical history of Allergy, Anxiety, Asthma, Depression, Eczema, Endometriosis, Migraine with aura, Nasal bone fx-closed (06/23/2012), and POTS (postural orthostatic tachycardia syndrome).   HPI  Discussed the use of AI scribe software for clinical note transcription with the patient, who gave verbal consent to proceed. The patient, with asthma, presents with fever, chills, and respiratory symptoms.  She has been experiencing fever and chills for approximately three days, with significant body aches and joint pains. She does not have a thermometer at home to measure her temperature.  Respiratory symptoms include a cough, which is sometimes productive, shortness of breath, and wheezing. She had an asthma attack earlier in the day, relieved by using her inhaler. She has an inhaler and rescue therapies at home.  No nasal congestion or runny nose, but she reports post-nasal drip. She experiences nausea and mild abdominal pain, affecting her ability to eat. She has not taken any medication for nausea.  She has a history of cold sores and initially thought she had a cold sore in the back of her throat, associated with throat pain at the onset of her illness. She does not take specific medication for cold sore outbreaks but uses a mouthwash, the name of which she cannot recall.  COVID, flu, and strep tests were all negative. She is not aware of anyone around her being sick with similar symptoms.    Past Medical History:  Diagnosis Date   Allergy    Anxiety    Asthma    prn inhaler   Depression    Eczema    Endometriosis    Migraine with aura    Nasal bone fx-closed 06/23/2012   has a cut on nose from the injury   POTS (postural orthostatic  tachycardia syndrome)     Patient Active Problem List   Diagnosis Date Noted   Leg weakness, bilateral 12/20/2023   Numbness of right foot 12/20/2023   Syncope 12/20/2023   Gait abnormality 12/20/2023   Muscle weakness of proximal extremity 12/20/2023   Mild intermittent asthma without complication 03/11/2023   Chronic rhinitis 03/11/2023   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   MDD (major depressive disorder), recurrent severe, 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   Family history of brain aneurysm 06/27/2021   New daily persistent headache 06/26/2021   Migraine without aura and without status migrainosus, not intractable 06/26/2021   Bipolar I disorder, current or most recent episode depressed, with psychotic features (HCC) 04/26/2020   Overdose of antidepressant, intentional self-harm, initial encounter (HCC) 04/24/2020   Pilonidal abscess 08/14/2019   Mild persistent asthma 08/15/2015   Allergic rhinoconjunctivitis 08/15/2015   Atopic dermatitis 08/15/2015   Allergic urticaria 08/15/2015   Allergic reaction 08/15/2015    Past Surgical History:  Procedure Laterality Date   CLOSED REDUCTION NASAL FRACTURE  06/30/2012   Procedure: CLOSED REDUCTION NASAL FRACTURE;  Surgeon: Ida Loader, MD;  Location: [facility];  Service: ENT;  Laterality: N/A;   EXCISION, ENDOMETRIOSIS, LAPAROSCOPIC     07/10/2023    OB History     Gravida  0   Para  0   Term  0   Preterm  0   AB  0  Living  0      SAB  0   IAB  0   Ectopic  0   Multiple  0   Live Births  0            Home Medications    Prior to Admission medications   Medication Sig Start Date End Date Taking? Authorizing Provider  ondansetron  (ZOFRAN -ODT) 4 MG disintegrating tablet Take 1 tablet (4 mg total) by mouth every 8 (eight) hours as needed for nausea or vomiting. 07/06/24  Yes Cheray Pardi E, PA-C  albuterol  (VENTOLIN  HFA) 108 (90 Base) MCG/ACT inhaler Inhale 2 puffs into the  lungs every 6 (six) hours as needed. Patient taking differently: Inhale 2 puffs into the lungs every 6 (six) hours as needed for wheezing or shortness of breath. 02/26/21   Iva Marty Saltness, MD  cariprazine  (VRAYLAR ) 3 MG capsule Take 1 capsule (3 mg total) by mouth daily. 04/27/24   Mozingo, Regina Nattalie, NP  EPINEPHrine  (EPIPEN  2-PAK) 0.3 mg/0.3 mL IJ SOAJ injection Inject 0.3 mLs (0.3 mg total) into the muscle once. As needed for severe life-threatening allergic reaction Patient taking differently: Inject 0.3 mg into the muscle once as needed for anaphylaxis (severe life-threatening allergic reaction). 08/15/15   Kozlow, Camellia PARAS, MD  midodrine (PROAMATINE) 5 MG tablet Take 5 mg by mouth as needed (POTS).    [provider]  propranolol  (INDERAL ) 10 MG tablet Take 10 mg by mouth as needed. 10/14/23 10/13/24  [provider]  propranolol  ER (INDERAL  LA) 120 MG 24 hr capsule Take 120 mg by mouth daily. Patient not taking: Reported on 12/18/2023    [provider]    Family History Family History  Problem Relation Age of Onset   Allergic rhinitis Mother    Allergic rhinitis Father    Heart disease Maternal Grandfather        MI   Heart disease Paternal Grandmother    Heart disease Paternal Grandfather     Social History Social History   Tobacco Use   Smoking status: Never   Smokeless tobacco: Never  Vaping Use   Vaping status: Never Used  Substance Use Topics   Alcohol use: Yes    Comment: occasaional   Drug use: No     Allergies   Other, Peanut-containing drug products, Penicillins, and Serotonin reuptake inhibitors (ssris)   Review of Systems Review of Systems  Constitutional:  Positive for chills, fatigue and fever.  HENT:  Positive for congestion, postnasal drip, rhinorrhea and sore throat. Negative for ear pain.   Respiratory:  Positive for cough, shortness of breath and wheezing.   Gastrointestinal:  Positive for nausea. Negative for  abdominal pain, diarrhea and vomiting.  Musculoskeletal:  Positive for myalgias. Negative for arthralgias.     Physical Exam Triage Vital Signs ED Triage Vitals  Encounter Vitals Group     BP 07/06/24 0902 124/79     Girls Systolic BP Percentile --      Girls Diastolic BP Percentile --      Boys Systolic BP Percentile --      Boys Diastolic BP Percentile --      Pulse Rate 07/06/24 0902 (!) 104     Resp 07/06/24 0902 17     Temp 07/06/24 0902 98.3 F (36.8 C)     Temp Source 07/06/24 0902 Temporal     SpO2 07/06/24 0902 96 %     Weight 07/06/24 0902 146 lb (66.2 kg)  Height 07/06/24 0902 5' 5 (1.651 m)     Head Circumference --      Peak Flow --      Pain Score 07/06/24 0924 5     Pain Loc --      Pain Education --      Exclude from Growth Chart --    No data found.  Updated Vital Signs BP 124/79 (BP Location: Right Arm)   Pulse (!) 104   Temp 98.3 F (36.8 C) (Temporal)   Resp 17   Ht 5' 5 (1.651 m)   Wt 146 lb (66.2 kg)   LMP 06/23/2024 (Exact Date)   SpO2 96%   Breastfeeding No   BMI 24.30 kg/m   Visual Acuity Right Eye Distance:   Left Eye Distance:   Bilateral Distance:    Right Eye Near:   Left Eye Near:    Bilateral Near:     Physical Exam Vitals reviewed.  Constitutional:      General: She is awake.     Appearance: Normal appearance. She is well-developed and well-groomed.  HENT:     Head: Normocephalic and atraumatic.     Right Ear: Hearing, tympanic membrane and ear canal normal.     Left Ear: Hearing, tympanic membrane and ear canal normal.     Mouth/Throat:     Lips: Pink.     Mouth: Mucous membranes are moist. Oral lesions present.     Pharynx: Oropharynx is clear. Uvula midline. No pharyngeal swelling, oropharyngeal exudate, posterior oropharyngeal erythema, uvula swelling or postnasal drip.   Cardiovascular:     Rate and Rhythm: Regular rhythm. Tachycardia present.     Pulses: Normal pulses.          Radial pulses are 2+ on  the right side and 2+ on the left side.     Heart sounds: Normal heart sounds. No murmur heard.    No friction rub. No gallop.  Pulmonary:     Effort: Pulmonary effort is normal.     Breath sounds: Normal breath sounds. No decreased air movement. No decreased breath sounds, wheezing, rhonchi or rales.  Musculoskeletal:     Cervical back: Normal range of motion and neck supple.  Lymphadenopathy:     Head:     Right side of head: No submental, submandibular or preauricular adenopathy.     Left side of head: No submental, submandibular or preauricular adenopathy.     Cervical:     Right cervical: No superficial cervical adenopathy.    Left cervical: No superficial cervical adenopathy.     Upper Body:     Right upper body: No supraclavicular adenopathy.     Left upper body: No supraclavicular adenopathy.  Skin:    General: Skin is warm and dry.  Neurological:     General: No focal deficit present.     Mental Status: She is alert and oriented to person, place, and time.  Psychiatric:        Mood and Affect: Mood normal.        Behavior: Behavior normal. Behavior is cooperative.      UC Treatments / Results  Labs (all labs ordered are listed, but only abnormal results are displayed) Labs Reviewed  POC COVID19/FLU A&B COMBO  POCT RAPID STREP A (OFFICE)    EKG   Radiology No results found.  Procedures Procedures (including critical care time)  Medications Ordered in UC Medications - No data to display  Initial Impression / Assessment and Plan /  UC Course  I have reviewed the triage vital signs and the nursing notes.  Pertinent labs & imaging results that were available during my care of the patient were reviewed by me and considered in my medical decision making (see chart for details).      Final Clinical Impressions(s) / UC Diagnoses   Final diagnoses:  Symptoms of upper respiratory infection (URI)  Nausea without vomiting   Acute viral upper respiratory  infection Symptoms include fever, chills, sore throat, post-nasal drip, cough, and body aches for three days. COVID, flu, and strep tests are negative, indicating a viral etiology. Likely a viral illness given the current prevalence of viral infections. Physical exam is largely reassuring at this time. Vitals notable for mild tachycardia but pt does have previous hx of POTS and current viral infection may be contributing to symptoms.  - Recommend hydration and small portions of bland foods. - Continue over-the-counter medications for symptom relief. - Advise seeking emergency care if fever or chills persist despite medication or if significant respiratory distress occurs.  Asthma Asthma with recent exacerbation characterized by shortness of breath and wheezing. Symptoms improved with inhaler use. No evidence of concerning lung findings on exam. - Use rescue inhalers and breathing treatments as needed, especially during shortness of breath or coughing spells. - Seek emergency care if significant respiratory distress occurs.  Nausea Nausea present without vomiting. No prior treatment for nausea. - Prescribe Zofran  for nausea management. - Advise seeking emergency care if nausea or vomiting prevents adequate hydration or nutrition, raising concerns about dehydration.  Patient has potential HSV lesion along the back of the oral mucosa.  Since symptoms have been ongoing and lesion has been present for greater than 72 hours I am skeptical that antiviral medications would provide much relief.  Recommend continued use of OTC mouthwashes, salt water gargles as needed for further relief     Discharge Instructions      VISIT SUMMARY:  You came in today with fever, chills, and respiratory symptoms, including a cough, shortness of breath, and wheezing. You also reported body aches, joint pains, nausea, and mild abdominal pain. Your COVID, flu, and strep tests were negative, indicating a viral infection.  You had an asthma attack earlier today, which was relieved by using your inhaler.  YOUR PLAN:  -ACUTE VIRAL UPPER RESPIRATORY INFECTION: You have a viral infection causing fever, chills, sore throat, post-nasal drip, cough, and body aches. Since your COVID, flu, and strep tests were negative, it is likely a common viral illness. Please stay hydrated and continue using over-the-counter medications to relieve your symptoms. If your fever or chills persist despite medication or if you experience significant respiratory distress, seek emergency care.  -ASTHMA: Your asthma has recently worsened, causing shortness of breath and wheezing. This was improved with your inhaler. Continue using your rescue inhalers and breathing treatments as needed, especially during shortness of breath or coughing spells. If you experience significant respiratory distress, seek emergency care.  -NAUSEA: You are experiencing nausea without vomiting. To help manage this, I have prescribed Zofran . If your nausea or vomiting prevents you from staying hydrated or eating properly, seek emergency care to avoid dehydration.  INSTRUCTIONS:  Please follow up if your symptoms do not improve or if you experience any worsening of your condition. Seek emergency care if you have persistent fever or chills, significant respiratory distress, or if nausea or vomiting prevents adequate hydration or nutrition.     ED Prescriptions     Medication Sig  Dispense Auth. Provider   ondansetron  (ZOFRAN -ODT) 4 MG disintegrating tablet Take 1 tablet (4 mg total) by mouth every 8 (eight) hours as needed for nausea or vomiting. 20 tablet Jaleiyah Alas E, PA-C      PDMP not reviewed this encounter.   Marylene Rocky BRAVO, PA-C 07/06/24 1040

## 2024-07-12 ENCOUNTER — Ambulatory Visit: Payer: Self-pay | Admitting: Adult Health

## 2024-07-12 DIAGNOSIS — Z0389 Encounter for observation for other suspected diseases and conditions ruled out: Secondary | ICD-10-CM

## 2024-07-12 NOTE — Progress Notes (Signed)
No show appointment  

## 2024-08-31 NOTE — Progress Notes (Deleted)
 Office Visit Note  Patient: Ashlee Perez             Date of Birth: 10/25/2005           MRN: [id number]             PCP: Madalyn Nest, MD Referring: Kline, Chianne, PA-C Visit Date: 09/08/2024 Occupation: Data Unavailable  Subjective:    History of Present Illness: Ashlee Perez is a 18 y.o. female who presents today for a new patient consultation.       Activities of Daily Living:  Patient reports morning stiffness for *** {minute/hour:19697}.   Patient {ACTIONS;DENIES/REPORTS:21021675::Denies} nocturnal pain.  Difficulty dressing/grooming: {ACTIONS;DENIES/REPORTS:21021675::Denies} Difficulty climbing stairs: {ACTIONS;DENIES/REPORTS:21021675::Denies} Difficulty getting out of chair: {ACTIONS;DENIES/REPORTS:21021675::Denies} Difficulty using hands for taps, buttons, cutlery, and/or writing: {ACTIONS;DENIES/REPORTS:21021675::Denies}  No Rheumatology ROS completed.   PMFS History:  Patient Active Problem List   Diagnosis Date Noted   Leg weakness, bilateral 12/20/2023   Numbness of right foot 12/20/2023   Syncope 12/20/2023   Gait abnormality 12/20/2023   Muscle weakness of proximal extremity 12/20/2023   Mild intermittent asthma without complication 03/11/2023   Chronic rhinitis 03/11/2023   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   MDD (major depressive disorder), recurrent severe, 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   Family history of brain aneurysm 06/27/2021   New daily persistent headache 06/26/2021   Migraine without aura and without status migrainosus, not intractable 06/26/2021   Bipolar I disorder, current or most recent episode depressed, with psychotic features (HCC) 04/26/2020   Overdose of antidepressant, intentional self-harm, initial encounter (HCC) 04/24/2020   Pilonidal abscess 08/14/2019   Mild persistent asthma 08/15/2015   Allergic rhinoconjunctivitis 08/15/2015   Atopic dermatitis 08/15/2015    Allergic urticaria 08/15/2015   Allergic reaction 08/15/2015    Past Medical History:  Diagnosis Date   Allergy    Anxiety    Asthma    prn inhaler   Depression    Eczema    Endometriosis    Migraine with aura    Nasal bone fx-closed 06/23/2012   has a cut on nose from the injury   POTS (postural orthostatic tachycardia syndrome)     Family History  Problem Relation Age of Onset   Allergic rhinitis Mother    Allergic rhinitis Father    Heart disease Maternal Grandfather        MI   Heart disease Paternal Grandmother    Heart disease Paternal Grandfather    Past Surgical History:  Procedure Laterality Date   CLOSED REDUCTION NASAL FRACTURE  06/30/2012   Procedure: CLOSED REDUCTION NASAL FRACTURE;  Surgeon: Ida Loader, MD;  Location: [facility];  Service: ENT;  Laterality: N/A;   EXCISION, ENDOMETRIOSIS, LAPAROSCOPIC     07/10/2023   Social History   Tobacco Use   Smoking status: Never   Smokeless tobacco: Never  Vaping Use   Vaping status: Never Used  Substance Use Topics   Alcohol use: Yes    Comment: occasaional   Drug use: No   Social History   Social History Narrative   Lives with boyfriend  1 dogs, 3 cat.   Working none   Caffiene 2-3 cans soda daily     Immunization History  Administered Date(s) Administered   PFIZER(Purple Top)SARS-COV-2 Vaccination 06/01/2020, 06/22/2020     Objective: Vital Signs: There were no vitals taken for this visit.   Physical Exam Vitals and nursing note reviewed.  Constitutional:      Appearance: She is well-developed.  HENT:     Head: Normocephalic and atraumatic.  Eyes:     Conjunctiva/sclera: Conjunctivae normal.  Cardiovascular:     Rate and Rhythm: Normal rate and regular rhythm.     Heart sounds: Normal heart sounds.  Pulmonary:     Effort: Pulmonary effort is normal.     Breath sounds: Normal breath sounds.  Abdominal:     General: Bowel sounds are normal.      Palpations: Abdomen is soft.  Musculoskeletal:     Cervical back: Normal range of motion.  Lymphadenopathy:     Cervical: No cervical adenopathy.  Skin:    General: Skin is warm and dry.     Capillary Refill: Capillary refill takes less than 2 seconds.  Neurological:     Mental Status: She is alert and oriented to person, place, and time.  Psychiatric:        Behavior: Behavior normal.     Musculoskeletal Exam: ***  CDAI Exam: CDAI Score: -- Patient Global: --; Provider Global: -- Swollen: --; Tender: -- Joint Exam 09/08/2024   No joint exam has been documented for this visit   There is currently no information documented on the homunculus. Go to the Rheumatology activity and complete the homunculus joint exam.  Investigation: No additional findings.  Imaging: No results found.  Recent Labs: Lab Results  Component Value Date   WBC 6.3 05/02/2024   HGB 14.1 05/02/2024   PLT 343 05/02/2024   NA 137 05/02/2024   K 3.7 05/02/2024   CL 103 05/02/2024   CO2 25 05/02/2024   GLUCOSE 94 05/02/2024   BUN 9 05/02/2024   CREATININE 0.78 05/02/2024   BILITOT 0.9 02/12/2022   ALKPHOS 67 02/12/2022   AST 21 02/12/2022   ALT 12 02/12/2022   PROT 7.2 02/12/2022   ALBUMIN 4.0 02/12/2022   CALCIUM 9.3 05/02/2024   GFRAA NOT CALCULATED 04/24/2020    Speciality Comments: No specialty comments available.  Procedures:  No procedures performed Allergies: Other, Peanut-containing drug products, Penicillins, and Serotonin reuptake inhibitors (ssris)   Assessment / Plan:     Visit Diagnoses: Positive ANA (antinuclear antibody)  Polyarthralgia  Migraine without aura and without status migrainosus, not intractable  Mild persistent asthma without complication  Allergic rhinoconjunctivitis  Allergic urticaria  MDD (major depressive disorder), recurrent severe, without psychosis (HCC)  Bipolar I disorder, current or most recent episode depressed, with psychotic features  (HCC)  Orders: No orders of the defined types were placed in this encounter.  No orders of the defined types were placed in this encounter.   Face-to-face time spent with patient was *** minutes. Greater than 50% of time was spent in counseling and coordination of care.  Follow-Up Instructions: No follow-ups on file.   Waddell CHRISTELLA Craze, PA-C  Note - This record has been created using Dragon software.  Chart creation errors have been sought, but may not always  have been located. Such creation errors do not reflect on  the standard of medical care.

## 2024-09-08 ENCOUNTER — Encounter: Admitting: Physician Assistant

## 2024-09-08 DIAGNOSIS — R7689 Other specified abnormal immunological findings in serum: Secondary | ICD-10-CM

## 2024-09-08 DIAGNOSIS — H1010 Acute atopic conjunctivitis, unspecified eye: Secondary | ICD-10-CM

## 2024-09-08 DIAGNOSIS — F315 Bipolar disorder, current episode depressed, severe, with psychotic features: Secondary | ICD-10-CM

## 2024-09-08 DIAGNOSIS — L500 Allergic urticaria: Secondary | ICD-10-CM

## 2024-09-08 DIAGNOSIS — G43009 Migraine without aura, not intractable, without status migrainosus: Secondary | ICD-10-CM

## 2024-09-08 DIAGNOSIS — M2550 Pain in unspecified joint: Secondary | ICD-10-CM

## 2024-09-08 DIAGNOSIS — F332 Major depressive disorder, recurrent severe without psychotic features: Secondary | ICD-10-CM

## 2024-09-08 DIAGNOSIS — J4530 Mild persistent asthma, uncomplicated: Secondary | ICD-10-CM
